# Patient Record
Sex: Male | Born: 1955 | Race: White | Hispanic: Yes | Marital: Single | Smoking: Never smoker
Health system: Southern US, Community
[De-identification: ages and names within clinical notes are randomized; demographics above are authoritative.]

## PROBLEM LIST (undated history)

## (undated) ENCOUNTER — Emergency Department (HOSPITAL_COMMUNITY): Admission: EM | Payer: Self-pay | Source: Home / Self Care

## (undated) DIAGNOSIS — E119 Type 2 diabetes mellitus without complications: Secondary | ICD-10-CM

## (undated) DIAGNOSIS — I10 Essential (primary) hypertension: Secondary | ICD-10-CM

## (undated) DIAGNOSIS — I639 Cerebral infarction, unspecified: Secondary | ICD-10-CM

## (undated) HISTORY — PX: APPENDECTOMY: SHX54

---

## 2016-08-16 ENCOUNTER — Encounter: Payer: Self-pay | Admitting: Pediatric Intensive Care

## 2016-08-22 ENCOUNTER — Encounter: Payer: Self-pay | Admitting: Pediatric Intensive Care

## 2016-08-22 DIAGNOSIS — E119 Type 2 diabetes mellitus without complications: Secondary | ICD-10-CM

## 2016-08-22 DIAGNOSIS — E118 Type 2 diabetes mellitus with unspecified complications: Secondary | ICD-10-CM

## 2016-08-22 LAB — GLUCOSE, POCT (MANUAL RESULT ENTRY): POC Glucose: 284 mg/dl — AB (ref 70–99)

## 2016-09-01 NOTE — Congregational Nurse Program (Signed)
Congregational Nurse Program Note  Date of Encounter: 08/16/2016  Past Medical History: No past medical history on file.  Encounter Details:     CNP Questionnaire - 08/16/16 0900      Patient Demographics   Is this a new or existing patient? New   Patient is considered a/an Refugee   Race Latino/Hispanic     Patient Assistance   Location of Patient Assistance [facility]   Patient's financial/insurance status Medicaid   Uninsured Patient ([employer]ange Research officer, trade unionCard/Care Connects) No   Patient referred to apply for the following financial assistance Medicaid   Food insecurities addressed Not Applicable   Transportation assistance Yes   Type of Assistance Bus Pass Given   Assistance securing medications No   Educational health offerings Diabetes;Navigating the healthcare system     Encounter Details   Primary purpose of visit Chronic Illness/Condition Visit;Navigating the Healthcare System   Was an Emergency Department visit averted? Yes   Does patient have a medical provider? No   Patient referred to Clinic   Was a mental health screening completed? (GAINS tool) No   Does patient have dental issues? No   Does patient have vision issues? No   Does your patient have an abnormal blood pressure today? No   Since previous encounter, have you referred patient for abnormal blood pressure that resulted in a new diagnosis or medication change? No   Does your patient have an abnormal blood glucose today? Yes   Since previous encounter, have you referred patient for abnormal blood glucose that resulted in a new diagnosis or medication change? No   Was there a life-saving intervention made? No     Client new to [facility]. Has a bedspace. [place] and [place] [facility]. States history of HTN, diabetes (on metformin only) and high cholesterol. He is currently out of metformin. He takes Lisinopril 20mg  qday and HCTZ 25mg  qday.  He does not have a glucometer. BG  done- 318 2 hours pc. [facility]. Buss passes given. Client will follow up in [facility] clinic for BG, BP checks. CN will contact client's PCP in [place] to check status of prescriptions and assist client with transferring Medicaid if necessary.

## 2016-09-03 DIAGNOSIS — E119 Type 2 diabetes mellitus without complications: Secondary | ICD-10-CM | POA: Insufficient documentation

## 2016-09-03 NOTE — Congregational Nurse Program (Signed)
Congregational Nurse Program Note  Date of Encounter: 08/22/2016  Past Medical History: No past medical history on file.  Encounter Details:     CNP Questionnaire - 08/22/16 0900      Patient Demographics   Is this a new or existing patient? Existing   Patient is considered a/an Refugee   Race Latino/Hispanic     Patient Assistance   Location of Patient Assistance [facility]   Patient's financial/insurance status Medicaid   Uninsured Patient ([employer] officer, trade unionCard/Care Connects) No   Patient referred to apply for the following financial assistance Medicaid   Food insecurities addressed Not Applicable   Transportation assistance No   Type of Assistance Other   Assistance securing medications No   Educational health offerings Hypertension;Diabetes;Navigating the healthcare system     Encounter Details   Primary purpose of visit Chronic Illness/Condition Visit;Navigating the Healthcare System;Post PCP Visit   Was an Emergency Department visit averted? Not Applicable   Does patient have a medical provider? Yes   Patient referred to Clinic;Follow up with established PCP   Was a mental health screening completed? (GAINS tool) No   Does patient have dental issues? No   Does patient have vision issues? No   Does your patient have an abnormal blood pressure today? No   Since previous encounter, have you referred patient for abnormal blood pressure that resulted in a new diagnosis or medication change? No   Does your patient have an abnormal blood glucose today? No   Since previous encounter, have you referred patient for abnormal blood glucose that resulted in a new diagnosis or medication change? No   Was there a life-saving intervention made? No     BG check. Client [facility] and C. Evans RN assisted client with obtaining metformin prescription.Client states that he's having increased back pain and muscle spasms. [facility] as he has taken tramadol  in the past. BG 284 2 hours PC. CN will obtain glucometer for client so he can test fasting blood sugar.

## 2016-09-03 NOTE — Congregational Nurse Program (Signed)
Congregational Nurse Program Note  Date of Encounter: 08/17/2016  Past Medical History: No past medical history on file.  Encounter Details:     CNP Questionnaire - 08/22/16 0900      Patient Demographics   Is this a new or existing patient? Existing   Patient is considered a/an Refugee   Race Latino/Hispanic     Patient Assistance   Location of Patient Assistance [facility]   Patient's financial/insurance status Medicaid   Uninsured Patient ([employer] Research officer, trade unionCard/Care Connects) No   Patient referred to apply for the following financial assistance Medicaid   Food insecurities addressed Not Applicable   Transportation assistance No   Type of Assistance Other   Assistance securing medications No   Educational health offerings Hypertension;Diabetes;Navigating the healthcare system     Encounter Details   Primary purpose of visit Chronic Illness/Condition Visit;Navigating the Healthcare System;Post PCP Visit   Was an Emergency Department visit averted? Not Applicable   Does patient have a medical provider? Yes   Patient referred to Clinic;Follow up with established PCP   Was a mental health screening completed? (GAINS tool) No   Does patient have dental issues? No   Does patient have vision issues? No   Does your patient have an abnormal blood pressure today? No   Since previous encounter, have you referred patient for abnormal blood pressure that resulted in a new diagnosis or medication change? No   Does your patient have an abnormal blood glucose today? No   Since previous encounter, have you referred patient for abnormal blood glucose that resulted in a new diagnosis or medication change? No   Was there a life-saving intervention made? No    Assisted client with obtaining medications.  [facility] and given to client with instructions

## 2016-09-05 ENCOUNTER — Encounter: Payer: Self-pay | Admitting: Pediatric Intensive Care

## 2016-09-09 ENCOUNTER — Encounter: Payer: Self-pay | Admitting: Pediatric Intensive Care

## 2016-09-12 ENCOUNTER — Encounter: Payer: Self-pay | Admitting: Pediatric Intensive Care

## 2016-09-23 ENCOUNTER — Encounter: Payer: Self-pay | Admitting: Pediatric Intensive Care

## 2016-09-27 ENCOUNTER — Encounter: Payer: Self-pay | Admitting: Pediatric Intensive Care

## 2016-09-29 NOTE — Congregational Nurse Program (Signed)
Congregational Nurse Program Note  Date of Encounter: 09/23/2016  Past Medical History: No past medical history on file.  Encounter Details:     CNP Questionnaire - 09/23/16 1000      Patient Demographics   Is this a new or existing patient? Existing   Patient is considered a/an Refugee   Race Latino/Hispanic     Patient Assistance   Location of Patient Assistance [facility]   Patient's financial/insurance status Self-Pay (Uninsured)   Uninsured Patient ([employer]ange Research officer, trade unionCard/Care Connects) No   Patient referred to apply for the following financial assistance Medicaid;Orange Research officer, trade unionCard/Care Connects   Food insecurities addressed Not Applicable   Transportation assistance No   Assistance securing medications Yes   Type of Assistance Other   Educational health offerings Hypertension;Diabetes     Encounter Details   Primary purpose of visit Chronic Illness/Condition Visit;Education/Health Concerns;Navigating the Healthcare System   Was an Emergency Department visit averted? Not Applicable   Does patient have a medical provider? Yes   Patient referred to Follow up with established PCP   Was a mental health screening completed? (GAINS tool) No   Does patient have dental issues? No   Does patient have vision issues? No   Does your patient have an abnormal blood pressure today? No   Since previous encounter, have you referred patient for abnormal blood pressure that resulted in a new diagnosis or medication change? No   Does your patient have an abnormal blood glucose today? No   Since previous encounter, have you referred patient for abnormal blood glucose that resulted in a new diagnosis or medication change? No   Was there a life-saving intervention made? No     Client needs medication refills and glucometer test strips. Checking BGs in morning- states they've been "up and down". BG check on client glucomter 2 hours PC is 171.

## 2016-10-03 NOTE — Congregational Nurse Program (Signed)
Congregational Nurse Program Note  Date of Encounter: 09/12/2016  Past Medical History: No past medical history on file.  Encounter Details:     CNP Questionnaire - 09/23/16 1000      Patient Demographics   Is this a new or existing patient? Existing   Patient is considered a/an Refugee   Race Latino/Hispanic     Patient Assistance   Location of Patient Assistance [facility]   Patient's financial/insurance status Self-Pay (Uninsured)   Uninsured Patient ([employer] Research officer, trade unionCard/Care Connects) No   Patient referred to apply for the following financial assistance Medicaid;Orange Research officer, trade unionCard/Care Connects   Food insecurities addressed Not Applicable   Transportation assistance No   Assistance securing medications Yes   Type of Assistance Other   Educational health offerings Hypertension;Diabetes     Encounter Details   Primary purpose of visit Chronic Illness/Condition Visit;Education/Health Concerns;Navigating the Healthcare System   Was an Emergency Department visit averted? Not Applicable   Does patient have a medical provider? Yes   Patient referred to Follow up with established PCP   Was a mental health screening completed? (GAINS tool) No   Does patient have dental issues? No   Does patient have vision issues? No   Does your patient have an abnormal blood pressure today? No   Since previous encounter, have you referred patient for abnormal blood pressure that resulted in a new diagnosis or medication change? No   Does your patient have an abnormal blood glucose today? No   Since previous encounter, have you referred patient for abnormal blood glucose that resulted in a new diagnosis or medication change? No   Was there a life-saving intervention made? No     Client states his BG was 174 this AM, 190s over the weekend. Will continue to follow.

## 2016-10-03 NOTE — Congregational Nurse Program (Signed)
Congregational Nurse Program Note  Date of Encounter: 09/09/2016  Past Medical History: No past medical history on file.  Encounter Details:     CNP Questionnaire - 09/23/16 1000      Patient Demographics   Is this a new or existing patient? Existing   Patient is considered a/an Refugee   Race Latino/Hispanic     Patient Assistance   Location of Patient Assistance [facility]   Patient's financial/insurance status Self-Pay (Uninsured)   Uninsured Patient ([employer]ange Research officer, trade unionCard/Care Connects) No   Patient referred to apply for the following financial assistance Medicaid;Orange Research officer, trade unionCard/Care Connects   Food insecurities addressed Not Applicable   Transportation assistance No   Assistance securing medications Yes   Type of Assistance Other   Educational health offerings Hypertension;Diabetes     Encounter Details   Primary purpose of visit Chronic Illness/Condition Visit;Education/Health Concerns;Navigating the Healthcare System   Was an Emergency Department visit averted? Not Applicable   Does patient have a medical provider? Yes   Patient referred to Follow up with established PCP   Was a mental health screening completed? (GAINS tool) No   Does patient have dental issues? No   Does patient have vision issues? No   Does your patient have an abnormal blood pressure today? No   Since previous encounter, have you referred patient for abnormal blood pressure that resulted in a new diagnosis or medication change? No   Does your patient have an abnormal blood glucose today? No   Since previous encounter, have you referred patient for abnormal blood glucose that resulted in a new diagnosis or medication change? No   Was there a life-saving intervention made? No     Client states Bg was 225 this morning. Merformin has been increased to 1000mg  bid. Client states he is waiting to hear about legal status so he can re-a[pply for Medicaid. [facility].

## 2016-10-04 NOTE — Congregational Nurse Program (Signed)
Congregational Nurse Program Note  Date of Encounter: 09/05/2016  Past Medical History: No past medical history on file.  Encounter Details:     CNP Questionnaire - 09/23/16 1000      Patient Demographics   Is this a new or existing patient? Existing   Patient is considered a/an Refugee   Race Latino/Hispanic     Patient Assistance   Location of Patient Assistance [facility]   Patient's financial/insurance status Self-Pay (Uninsured)   Uninsured Patient ([employer]ange Research officer, trade unionCard/Care Connects) No   Patient referred to apply for the following financial assistance Medicaid;Orange Research officer, trade unionCard/Care Connects   Food insecurities addressed Not Applicable   Transportation assistance No   Assistance securing medications Yes   Type of Assistance Other   Educational health offerings Hypertension;Diabetes     Encounter Details   Primary purpose of visit Chronic Illness/Condition Visit;Education/Health Concerns;Navigating the Healthcare System   Was an Emergency Department visit averted? Not Applicable   Does patient have a medical provider? Yes   Patient referred to Follow up with established PCP   Was a mental health screening completed? (GAINS tool) No   Does patient have dental issues? No   Does patient have vision issues? No   Does your patient have an abnormal blood pressure today? No   Since previous encounter, have you referred patient for abnormal blood pressure that resulted in a new diagnosis or medication change? No   Does your patient have an abnormal blood glucose today? No   Since previous encounter, have you referred patient for abnormal blood glucose that resulted in a new diagnosis or medication change? No   Was there a life-saving intervention made? No     BG 237 on client glucometer. Client states BGs have been variable. History showed AM AC BGs from 196-420. CN will [facility].

## 2016-10-17 ENCOUNTER — Encounter: Payer: Self-pay | Admitting: Pediatric Intensive Care

## 2016-10-21 ENCOUNTER — Encounter: Payer: Self-pay | Admitting: Pediatric Intensive Care

## 2016-10-21 NOTE — Congregational Nurse Program (Signed)
Congregational Nurse Program Note  Date of Encounter: 10/21/2016  Past Medical History: No past medical history on file.  Encounter Details:     CNP Questionnaire - 10/21/16 0900      Patient Demographics   Is this a new or existing patient? Existing   Patient is considered a/an Refugee   Race Latino/Hispanic     Patient Assistance   Location of Patient Assistance [facility]   Patient's financial/insurance status Self-Pay (Uninsured);Low Income   Uninsured Patient (Orange Research officer, trade unionCard/Care Connects) No   Patient referred to apply for the following financial assistance [employer] GoldCard/Care Connects   Food insecurities addressed Not Technical brewerApplicable   Transportation assistance No   Assistance securing medications Yes   Type of Holiday representativeAssistance [facility]   Educational health offerings Medications     Encounter Details   Primary purpose of visit Navigating the Healthcare System;Other   Was an Emergency Department visit averted? Not Applicable   Does patient have a medical provider? Yes   Patient referred to Follow up with established PCP   Was a mental health screening completed? (GAINS tool) No   Does patient have dental issues? No   Does patient have vision issues? No   Does your patient have an abnormal blood pressure today? No   Since previous encounter, have you referred patient for abnormal blood pressure that resulted in a new diagnosis or medication change? No   Does your patient have an abnormal blood glucose today? No   Since previous encounter, have you referred patient for abnormal blood glucose that resulted in a new diagnosis or medication change? No   Was there a life-saving intervention made? No     [facility] that [facility] CN called [facility] on 1/22 to have them transfer rx but pharmacist stated he had already transferred all medications. CN called [facility] on 1/22 and pharmacist stated that they had notified [facility] that  client needed refill. NP Placey contacted CN via email today [facility]. [facility] that [facility].

## 2016-10-28 ENCOUNTER — Encounter: Payer: Self-pay | Admitting: Pediatric Intensive Care

## 2016-11-07 NOTE — Congregational Nurse Program (Signed)
Congregational Nurse Program Note  Date of Encounter: 10/26/2016  Past Medical History: No past medical history on file.  Encounter Details:     CNP Questionnaire - 10/26/16 1414      Patient Demographics   Is this a new or existing patient? Existing   Patient is considered a/an Refugee   Race Latino/Hispanic     Patient Assistance   Location of Patient Assistance [facility]   Patient's financial/insurance status Self-Pay (Uninsured);Low Income   Uninsured Patient ([employer] Research officer, trade unionCard/Care Connects) No   Patient referred to apply for the following financial assistance [employer] GoldCard/Care Connects   Food insecurities addressed Not Technical brewerApplicable   Transportation assistance No   Assistance securing medications Yes   Type of Assistance Other   Educational health offerings Medications     Encounter Details   Primary purpose of visit Navigating the Healthcare System;Other   Was an Emergency Department visit averted? Not Applicable   Does patient have a medical provider? No   Patient referred to Follow up with established PCP   Was a mental health screening completed? (GAINS tool) No   Does patient have dental issues? No   Does patient have vision issues? No   Does your patient have an abnormal blood pressure today? No   Since previous encounter, have you referred patient for abnormal blood pressure that resulted in a new diagnosis or medication change? No   Does your patient have an abnormal blood glucose today? No   Since previous encounter, have you referred patient for abnormal blood glucose that resulted in a new diagnosis or medication change? No   Was there a life-saving intervention made? No     Assisted client in obtaining filled scripts from [facility]

## 2016-11-08 ENCOUNTER — Encounter: Payer: Self-pay | Admitting: Pediatric Intensive Care

## 2016-11-08 DIAGNOSIS — E119 Type 2 diabetes mellitus without complications: Secondary | ICD-10-CM

## 2016-11-08 LAB — GLUCOSE, POCT (MANUAL RESULT ENTRY): POC GLUCOSE: 429 mg/dL — AB (ref 70–99)

## 2016-11-10 NOTE — Congregational Nurse Program (Signed)
Congregational Nurse Program Note  Date of Encounter: 09/27/2016  Past Medical History: No past medical history on file.  Encounter Details:  BP check. Client needs Gabapentin refill. [facility] CN will assist with medications as needed.

## 2016-11-12 NOTE — Congregational Nurse Program (Signed)
Congregational Nurse Program Note  Date of Encounter: 10/17/2016  Past Medical History: No past medical history on file.  Encounter Details:     CNP Questionnaire - 10/26/16 1414      Patient Demographics   Is this a new or existing patient? Existing   Patient is considered a/an Refugee   Race Latino/Hispanic     Patient Assistance   Location of Patient Assistance [facility]   Patient's financial/insurance status Self-Pay (Uninsured);Low Income   Uninsured Patient ([employer] Research officer, trade unionCard/Care Connects) No   Patient referred to apply for the following financial assistance [employer] GoldCard/Care Connects   Food insecurities addressed Not Technical brewerApplicable   Transportation assistance No   Assistance securing medications Yes   Type of Assistance Other   Educational health offerings Medications     Encounter Details   Primary purpose of visit Navigating the Healthcare System;Other   Was an Emergency Department visit averted? Not Applicable   Does patient have a medical provider? No   Patient referred to Follow up with established PCP   Was a mental health screening completed? (GAINS tool) No   Does patient have dental issues? No   Does patient have vision issues? No   Does your patient have an abnormal blood pressure today? No   Since previous encounter, have you referred patient for abnormal blood pressure that resulted in a new diagnosis or medication change? No   Does your patient have an abnormal blood glucose today? No   Since previous encounter, have you referred patient for abnormal blood glucose that resulted in a new diagnosis or medication change? No   Was there a life-saving intervention made? No     Client states he is out of celexa. CN called [facility]. Client has refills but they will not refill unless client self-pays. [facility]

## 2016-11-24 NOTE — Congregational Nurse Program (Signed)
Congregational Nurse Program Note  Date of Encounter: 10/28/2016  Past Medical History: No past medical history on file.  Encounter Details:     CNP Questionnaire - 10/28/16 1000      Patient Demographics   Is this a new or existing patient? Existing   Patient is considered a/an Refugee   Race Latino/Hispanic     Patient Assistance   Location of Patient Assistance [facility]   Patient's financial/insurance status Self-Pay (Uninsured);Low Income   Uninsured Patient ([employer] Research officer, trade unionCard/Care Connects) Yes   Interventions Appt. has been completed   Patient referred to apply for the following financial assistance [employer]/Care Connects   Food insecurities addressed Not Technical brewerApplicable   Transportation assistance Yes   Type of Assistance [employer] Pass Given   Assistance securing medications Yes   Type of Assistance Other   Educational health offerings Medications;Navigating the healthcare system     Encounter Details   Primary purpose of visit Education/Health Concerns;Navigating the Healthcare System   Was an Emergency Department visit averted? Not Applicable   Does patient have a medical provider? Yes   Patient referred to Follow up with established PCP   Was a mental health screening completed? (GAINS tool) No   Does patient have dental issues? No   Does patient have vision issues? No   Does your patient have an abnormal blood pressure today? No   Since previous encounter, have you referred patient for abnormal blood pressure that resulted in a new diagnosis or medication change? No   Does your patient have an abnormal blood glucose today? No   Since previous encounter, have you referred patient for abnormal blood glucose that resulted in a new diagnosis or medication change? No   Was there a life-saving intervention made? No     [facility]. BP check.

## 2016-11-25 ENCOUNTER — Encounter: Payer: Self-pay | Admitting: Pediatric Intensive Care

## 2016-11-27 NOTE — Congregational Nurse Program (Signed)
Congregational Nurse Program Note  Date of Encounter: 11/08/2016  Past Medical History: No past medical history on file.  Encounter Details:     CNP Questionnaire - 11/08/16 0830      Patient Demographics   Is this a new or existing patient? Existing   Patient is considered a/an Refugee   Race Latino/Hispanic     Patient Assistance   Location of Patient Assistance [facility]   Patient's financial/insurance status Self-Pay (Uninsured);Low Income   Uninsured Patient ([place] Card/Care Connects) Yes   Interventions Assisted patient in making appt.   Patient referred to apply for the following financial assistance [employer] Card/Care Connects   Food insecurities addressed Not Applicable   Transportation assistance Yes   Type of Assistance [employer] Pass Given   Assistance securing medications No   Type of Assistance Other   Educational health offerings Diabetes     Encounter Details   Primary purpose of visit Chronic Illness/Condition Visit;Education/Health Concerns   Was an Emergency Department visit averted? Yes   Does patient have a medical provider? Yes   Patient referred to Follow up with established PCP   Was a mental health screening completed? (GAINS tool) No   Does patient have dental issues? No   Does patient have vision issues? No   Does your patient have an abnormal blood pressure today? No   Since previous encounter, have you referred patient for abnormal blood pressure that resulted in a new diagnosis or medication change? No   Does your patient have an abnormal blood glucose today? Yes   Since previous encounter, have you referred patient for abnormal blood glucose that resulted in a new diagnosis or medication change? No   Was there a life-saving intervention made? No     Client states he feels sick. Client is pale, somewhat diaphoretic. States his AM blood glucose have been 150-190. BG in office 429. CN [facility] and gave bus passes to client to follow up at  clinic.

## 2016-12-05 ENCOUNTER — Encounter: Payer: Self-pay | Admitting: Pediatric Intensive Care

## 2016-12-17 ENCOUNTER — Encounter (HOSPITAL_COMMUNITY): Payer: Self-pay | Admitting: Emergency Medicine

## 2016-12-17 ENCOUNTER — Emergency Department (HOSPITAL_COMMUNITY)
Admission: EM | Admit: 2016-12-17 | Discharge: 2016-12-17 | Disposition: A | Discharge status: Home / Self Care | Payer: Self-pay | Attending: Emergency Medicine | Admitting: Emergency Medicine

## 2016-12-17 ENCOUNTER — Emergency Department (HOSPITAL_COMMUNITY): Payer: Self-pay

## 2016-12-17 DIAGNOSIS — I10 Essential (primary) hypertension: Secondary | ICD-10-CM | POA: Insufficient documentation

## 2016-12-17 DIAGNOSIS — M5442 Lumbago with sciatica, left side: Secondary | ICD-10-CM | POA: Insufficient documentation

## 2016-12-17 DIAGNOSIS — Z8673 Personal history of transient ischemic attack (TIA), and cerebral infarction without residual deficits: Secondary | ICD-10-CM | POA: Insufficient documentation

## 2016-12-17 DIAGNOSIS — E119 Type 2 diabetes mellitus without complications: Secondary | ICD-10-CM | POA: Insufficient documentation

## 2016-12-17 DIAGNOSIS — M5441 Lumbago with sciatica, right side: Secondary | ICD-10-CM | POA: Insufficient documentation

## 2016-12-17 DIAGNOSIS — Z79899 Other long term (current) drug therapy: Secondary | ICD-10-CM | POA: Insufficient documentation

## 2016-12-17 DIAGNOSIS — Z7982 Long term (current) use of aspirin: Secondary | ICD-10-CM | POA: Insufficient documentation

## 2016-12-17 DIAGNOSIS — Z7984 Long term (current) use of oral hypoglycemic drugs: Secondary | ICD-10-CM | POA: Insufficient documentation

## 2016-12-17 HISTORY — DX: Cerebral infarction, unspecified: I63.9

## 2016-12-17 HISTORY — DX: Essential (primary) hypertension: I10

## 2016-12-17 HISTORY — DX: Type 2 diabetes mellitus without complications: E11.9

## 2016-12-17 LAB — URINALYSIS, ROUTINE W REFLEX MICROSCOPIC
BILIRUBIN URINE: NEGATIVE
Glucose, UA: NEGATIVE mg/dL
HGB URINE DIPSTICK: NEGATIVE
KETONES UR: 5 mg/dL — AB
Leukocytes, UA: NEGATIVE
Nitrite: NEGATIVE
PH: 5 (ref 5.0–8.0)
Protein, ur: NEGATIVE mg/dL
Specific Gravity, Urine: 1.025 (ref 1.005–1.030)

## 2016-12-17 MED ORDER — PREDNISONE 20 MG PO TABS: ORAL_TABLET | ORAL | 0 refills | Status: DC

## 2016-12-17 MED ORDER — HYDROCODONE-ACETAMINOPHEN 5-325 MG PO TABS
2.0000 | ORAL_TABLET | Freq: Once | ORAL | Status: AC
  Administered 2016-12-17: 2 via ORAL
  Filled 2016-12-17: qty 2

## 2016-12-17 MED ORDER — PREDNISONE 20 MG PO TABS
60.0000 mg | ORAL_TABLET | Freq: Once | ORAL | Status: AC
  Administered 2016-12-17: 60 mg via ORAL
  Filled 2016-12-17: qty 3

## 2016-12-17 MED ORDER — IBUPROFEN 400 MG PO TABS: 400.0000 mg | ORAL_TABLET | Freq: Four times a day (QID) | ORAL | 0 refills | Status: DC | PRN

## 2016-12-17 NOTE — ED Triage Notes (Addendum)
Pt to ER from home with complaint of lumbar back pain onset greater than 4 months ago. Ambulatory at present without difficulty, denies loss of bowel/bladder. Reports pain radiates down left leg. A/o x4.

## 2016-12-17 NOTE — ED Notes (Signed)
Pt transporting to xray. VSS

## 2016-12-17 NOTE — ED Provider Notes (Signed)
[facility]-EMERGENCY DEPT Provider Note   CSN: [id number] Arrival date & time: 12/17/16  0941     History   Chief Complaint Chief Complaint  Patient presents with  . Back Pain    HPI Terry Davidson is a 61 y.o. male.   Back Pain   This is a chronic problem. The current episode started more than 1 week ago. The problem occurs constantly. The problem has been gradually worsening. The pain is associated with no known injury. The pain is present in the lumbar spine. The quality of the pain is described as stabbing. The pain is mild.    Past Medical History:  Diagnosis Date  . Diabetes mellitus without complication (HCC)   . Hypertension   . Stroke [facility](HCC)     Patient Active Problem List   Diagnosis Date Noted  . Diabetes (HCC) 09/03/2016    Past Surgical History:  Procedure Laterality Date  . APPENDECTOMY         Home Medications    Prior to Admission medications   Medication Sig Start Date End Date Taking? Authorizing Provider  ASPIR-LOW 81 MG EC tablet Take 81 mg by mouth daily. 10/04/16  Yes Historical Provider, MD  atorvastatin (LIPITOR) 40 MG tablet Take 40 mg by mouth daily. 10/04/16  Yes Historical Provider, MD  cyclobenzaprine (FLEXERIL) 10 MG tablet Take 10 mg by mouth 3 (three) times daily as needed for muscle spasms.   Yes Historical Provider, MD  gabapentin (NEURONTIN) 300 MG capsule Take 300 mg by mouth at bedtime. 10/04/16  Yes Historical Provider, MD  hydrochlorothiazide (HYDRODIURIL) 25 MG tablet Take 25 mg by mouth daily. 10/04/16  Yes Historical Provider, MD  lisinopril (PRINIVIL,ZESTRIL) 20 MG tablet Take 20 mg by mouth daily. 10/04/16  Yes Historical Provider, MD  metFORMIN (GLUCOPHAGE) 500 MG tablet Take 500 mg by mouth 2 (two) times daily. 10/04/16  Yes Historical Provider, MD  ibuprofen (ADVIL,MOTRIN) 400 MG tablet Take 1 tablet (400 mg total) by mouth every 6 (six) hours as needed. 12/17/16   Marily MemosJason Kiani Wurtzel, MD  predniSONE (DELTASONE) 20 MG tablet 3 tabs po daily x  3 days, then 2 tabs x 3 days, then 1.5 tabs x 3 days, then 1 tab x 3 days, then 0.5 tabs x 3 days 12/17/16   Marily MemosJason Rafal Archuleta, MD    Family History History reviewed. No pertinent family history.  Social History Social History  Substance Use Topics  . Smoking status: Never Smoker  . Smokeless tobacco: Never Used  . Alcohol use No     Allergies   Ciprofloxacin and Morphine and related   Review of Systems Review of Systems  Musculoskeletal: Positive for back pain.  All other systems reviewed and are negative.    Physical Exam Updated Vital Signs BP 140/89   Pulse 70   Temp 98.1 F (36.7 C) (Oral)   Resp 17   SpO2 98%   Physical Exam  Constitutional: He appears well-developed and well-nourished.  HENT:  Head: Normocephalic and atraumatic.  Eyes: Conjunctivae and EOM are normal.  Neck: Normal range of motion.  Cardiovascular: Normal rate.   Pulmonary/Chest: Effort normal. No respiratory distress.  Abdominal: Soft. He exhibits no distension.  Musculoskeletal: Normal range of motion. He exhibits tenderness (midline lumbar). He exhibits no edema or deformity.  Neurological: He is alert.  Nursing note and vitals reviewed.    ED Treatments / Results  Labs (all labs ordered are listed, but only abnormal results are displayed) Labs Reviewed  URINALYSIS, ROUTINE  W REFLEX MICROSCOPIC - Abnormal; Notable for the following:       Result Value   Ketones, ur 5 (*)    All other components within normal limits    EKG  EKG Interpretation None       Radiology Dg Lumbar Spine Complete  Result Date: 12/17/2016 CLINICAL DATA:  Low back pain.  Clinical concern for fracture. EXAM: LUMBAR SPINE - COMPLETE 4+ VIEW COMPARISON:  None. FINDINGS: This report assumes 5 non rib-bearing lumbar vertebrae. Lumbar vertebral body heights are preserved, with no fracture. Moderate to severe degenerative disc disease throughout the lumbar spine, most prominent at L3-4, L4-5 and L5-S1. No  spondylolisthesis. Bilateral lower lumbar spine facet arthropathy. No aggressive appearing focal osseous lesions. IMPRESSION: 1. No lumbar spine fracture or spondylolisthesis. 2. Moderate to severe multilevel degenerative disc disease, most prominent in the lower lumbar spine. 3. Bilateral lower lumbar facet arthropathy. Electronically Signed   By: Delbert Phenix M.D.   On: 12/17/2016 10:54    Procedures Procedures (including critical care time)  Medications Ordered in ED Medications  HYDROcodone-acetaminophen (NORCO/VICODIN) 5-325 MG per tablet 2 tablet (2 tablets Oral Given 12/17/16 1051)  predniSONE (DELTASONE) tablet 60 mg (60 mg Oral Given 12/17/16 1051)     Initial Impression / Assessment and Plan / ED Course  I have reviewed the triage vital signs and the nursing notes.  Pertinent labs & imaging results that were available during my care of the patient were reviewed by me and considered in my medical decision making (see chart for details).     No acute change or new red flags in back pain. Improved in ER. Needs to establish nsg/pcp and follow up.   Final Clinical Impressions(s) / ED Diagnoses   Final diagnoses:  Midline low back pain with bilateral sciatica, unspecified chronicity    New Prescriptions Discharge Medication List as of 12/17/2016  2:34 PM    START taking these medications   Details  ibuprofen (ADVIL,MOTRIN) 400 MG tablet Take 1 tablet (400 mg total) by mouth every 6 (six) hours as needed., Starting Sat 12/17/2016, Print    predniSONE (DELTASONE) 20 MG tablet 3 tabs po daily x 3 days, then 2 tabs x 3 days, then 1.5 tabs x 3 days, then 1 tab x 3 days, then 0.5 tabs x 3 days, Print         Marily Memos, MD 12/18/16 1238

## 2016-12-19 MED FILL — predniSONE 20 MG TABS: 20 | 15 days supply | Qty: 27 | Fill #0

## 2016-12-19 MED FILL — IBUPROFEN 400 MG TABLET: 400 | 8 days supply | Qty: 30 | Fill #0

## 2016-12-29 NOTE — Congregational Nurse Program (Signed)
Congregational Nurse Program Note  Date of Encounter: 11/25/2016  Past Medical History: Past Medical History:  Diagnosis Date  . Diabetes mellitus without complication (HCC)   . Hypertension   . Stroke [facility])     Encounter Details:  Client states blood sugars improved since starting glipizide. Also states he is taking double amount of BP medication prescribed by NP. CN will clarify this with [facility] clinic.

## 2016-12-31 NOTE — Congregational Nurse Program (Signed)
Congregational Nurse Program Note  Date of Encounter: 12/05/2016  Past Medical History: Past Medical History:  Diagnosis Date  . Diabetes mellitus without complication (HCC)   . Hypertension   . Stroke [facility])     Encounter Details:     CNP Questionnaire - 12/05/16 0930      Patient Demographics   Is this a new or existing patient? Existing   Patient is considered a/an Refugee   Race Latino/Hispanic     Patient Assistance   Location of Patient Assistance [facility]   Patient's financial/insurance status Self-Pay (Uninsured);Low Income   Uninsured Patient ([employer] Research officer, trade union) Yes   Interventions Not Applicable   Patient referred to apply for the following financial assistance [employer] insecurities addressed Not Technical brewer Yes   Type of Assistance [employer] Given   Assistance securing medications No   Type of Pharmacist, hospital the healthcare system     Encounter Details   Primary purpose of visit Navigating the Healthcare System   Was an Emergency Department visit averted? Not Applicable   Does patient have a medical provider? Yes   Patient referred to Follow up with established PCP   Was a mental health screening completed? (GAINS tool) No   Does patient have dental issues? No   Does patient have vision issues? No   Does your patient have an abnormal blood pressure today? No   Since previous encounter, have you referred patient for abnormal blood pressure that resulted in a new diagnosis or medication change? No   Does your patient have an abnormal blood glucose today? No   Since previous encounter, have you referred patient for abnormal blood glucose that resulted in a new diagnosis or medication change? No   Was there a life-saving intervention made? No     Client needs bus passes to get to DSS to complete [employer] application.

## 2017-06-06 ENCOUNTER — Encounter: Payer: Self-pay | Admitting: Pediatric Intensive Care

## 2017-07-02 NOTE — Congregational Nurse Program (Signed)
Congregational Nurse Program Note  Date of Encounter: 06/06/2017  Past Medical History: Past Medical History:  Diagnosis Date  . Diabetes mellitus without complication (HCC)   . Hypertension   . Stroke [facility])     Encounter Details:     CNP Questionnaire - 06/06/17 1045      Patient Demographics   Is this a new or existing patient? New   Patient is considered a/an Refugee   Race Latino/Hispanic     Patient Assistance   Location of Patient Assistance [facility]   Patient's financial/insurance status [employer] Research officer, trade union;Self-Pay (Uninsured);Low Income   Uninsured Patient ([place] Card/Care Connects) Yes   Interventions Appt. has been completed;Follow-up/Education/Support provided after completed appt.   Patient referred to apply for the following financial assistance Not Applicable   Food insecurities addressed Not Applicable   Transportation assistance Yes   Type of Assistance Bus Pass Given   Assistance securing medications Yes   Type of Assistance [facility] Department   Educational health offerings Navigating the healthcare system;Medications     Encounter Details   Primary purpose of visit Navigating the Healthcare System   Was an Emergency Department visit averted? Not Applicable   Does patient have a medical provider? Yes   Patient referred to Follow up with established PCP   Was a mental health screening completed? (GAINS tool) No   Does patient have dental issues? No   Does patient have vision issues? No   Does your patient have an abnormal blood pressure today? Yes   Since previous encounter, have you referred patient for abnormal blood pressure that resulted in a new diagnosis or medication change? No   Does your patient have an abnormal blood glucose today? No   Since previous encounter, have you referred patient for abnormal blood glucose that resulted in a new diagnosis or medication change? No   Was there a life-saving intervention made? No     Client needs assistance with picking up his meds at [facility]. CN called [facility]- meds will be delivered to [facility] tomorrow. CN notified client.

## 2017-07-04 ENCOUNTER — Encounter: Payer: Self-pay | Admitting: Pediatric Intensive Care

## 2017-07-04 NOTE — Congregational Nurse Program (Signed)
Congregational Nurse Program Note  Date of Encounter: 07/04/2017  Past Medical History: Past Medical History:  Diagnosis Date  . Diabetes mellitus without complication (HCC)   . Hypertension   . Stroke [facility])     Encounter Details:     CNP Questionnaire - 07/04/17 1615      Patient Demographics   Is this a new or existing patient? New   Patient is considered a/an Refugee   Race Latino/Hispanic     Patient Assistance   Location of Patient Assistance [employer]   Patient's financial/insurance status Orange Research officer, trade union;Self-Pay (Uninsured)   Uninsured Patient ([place] Card/Care Connects) Yes   Interventions Appt. has been completed   Patient referred to apply for the following financial assistance Not Applicable   Food insecurities addressed Not Applicable   Transportation assistance No   Assistance securing medications No   Educational health offerings Medications;Safety     Encounter Details   Primary purpose of visit Safety;Education/Health Concerns   Was an Emergency Department visit averted? Not Applicable   Does patient have a medical provider? Yes   Patient referred to Follow up with established PCP   Was a mental health screening completed? (GAINS tool) No   Does patient have dental issues? No   Does patient have vision issues? No   Does your patient have an abnormal blood pressure today? Yes   Since previous encounter, have you referred patient for abnormal blood pressure that resulted in a new diagnosis or medication change? No   Does your patient have an abnormal blood glucose today? No   Since previous encounter, have you referred patient for abnormal blood glucose that resulted in a new diagnosis or medication change? No   Was there a life-saving intervention made? No     Client referred to see CN at this clinic site by Deborah Chalk CN [facility] nurse. Client expressed that he is depressed and anxious due to his immigration status. Client has open case with  [employer]. Client states that he has all medication except BP medication. Client states that he takes medication as prescribed. BP was somewhat elevated. CN called MA Placey NP to clarify BP medication. Instructed to have client increase metoprolol to  bid and follow up for BP check next week. All instructions written for client. Client has follow up appointment at [facility] on 10/29.

## 2017-07-11 ENCOUNTER — Encounter: Payer: Self-pay | Admitting: Pediatric Intensive Care

## 2017-07-21 ENCOUNTER — Encounter: Payer: Self-pay | Admitting: Pediatric Intensive Care

## 2017-08-04 NOTE — Congregational Nurse Program (Signed)
Congregational Nurse Program Note  Date of Encounter: 07/11/2017  Past Medical History: Past Medical History:  Diagnosis Date  . Diabetes mellitus without complication (HCC)   . Hypertension   . Stroke [facility](HCC)     Encounter Details: CNP Questionnaire - 07/21/17 1045      Questionnaire   Patient Status  Refugee    Race  Hispanic or Latino    [facility]    Insurance  Not Applicable    Uninsured  Uninsured (Subsequent visits/quarter)    Food  No food insecurities    Housing/Utilities  No permanent housing    Transportation  Yes, need transportation assistance;Provided transportation assistance (bus pass, taxi voucher, etc.)    Interpersonal Safety  Yes, feel physically and emotionally safe where you currently live    Medication  Yes, have medication insecurities    Medical Provider  Yes    Referrals  Other;Primary Care Provider/Clinic    ED Visit Averted  Not Applicable    Life-Saving Intervention Made  Not Applicable       BP check. Client has doubled his metoprolol as directed. States he has PCP appointment on 10/19. Follow up in CN clinic as needed.

## 2017-08-04 NOTE — Congregational Nurse Program (Signed)
Congregational Nurse Program Note  Date of Encounter: 07/21/2017  Past Medical History: Past Medical History:  Diagnosis Date  . Diabetes mellitus without complication (HCC)   . Hypertension   . Stroke [facility](HCC)     Encounter Details: CNP Questionnaire - 07/21/17 1045      Questionnaire   Patient Status  Refugee    Race  Hispanic or Latino    [facility]erved [facility]    Insurance  Not Applicable    Uninsured  Uninsured (Subsequent visits/quarter)    Food  No food insecurities    Housing/Utilities  No permanent housing    Transportation  Yes, need transportation assistance;Provided transportation assistance (bus pass, taxi voucher, etc.)    Interpersonal Safety  Yes, feel physically and emotionally safe where you currently live    Medication  Yes, have medication insecurities    Medical Provider  Yes    Referrals  Other;Primary Care Provider/Clinic    ED Visit Averted  Not Applicable    Life-Saving Intervention Made  Not Applicable     [facility] but doesn't [facility] where. CN [facility] and client has medication there. CN provided bus passes and gave detailed directions for busses to pharmacy. Client sttae [facility]. CN will email Kara MeadM Placey FNP regarding BP/HR. Follow up in CN clinic as needed.

## 2017-08-07 NOTE — Congregational Nurse Program (Signed)
Congregational Nurse Program Note  Date of Encounter: 07/03/2017  Past Medical History: Past Medical History:  Diagnosis Date  . Diabetes mellitus without complication (HCC)   . Hypertension   . Stroke [facility](HCC)     Encounter Details: CNP Questionnaire - 07/21/17 1045      Questionnaire   Patient Status  Refugee    Race  Hispanic or Latino    Location [facility] Served At  [facility]    Insurance  Not Applicable    Uninsured  Uninsured (Subsequent visits/quarter)    Food  No food insecurities    Housing/Utilities  No permanent housing    Transportation  Yes, need transportation assistance;Provided transportation assistance (bus pass, taxi voucher, etc.)    Interpersonal Safety  Yes, feel physically and emotionally safe where you currently live    Medication  Yes, have medication insecurities    Medical Provider  Yes    Referrals  Other;Primary Care Provider/Clinic    ED Visit Averted  Not Applicable    Life-Saving Intervention Made  Not Applicable      States he is feeling suicidal without a definite plan however has thoughts of cutting himself or hanging.  States is fearful of being deported.  States there are issues with his immigration status and he does not know what to do.  He states he would "rather die, than go back".  Scheduled to see Lavinia SharpsMary Ann Placey NP today.  TC to [place]Victoria Hussey CN- RN who works with client through [employer].  Client instructed to go to [employer] to discuss his immigration status and receive support.  Encouraged to return to see me.  Bus passes provided to go to [employer].

## 2017-10-20 ENCOUNTER — Encounter: Payer: Self-pay | Admitting: Pediatric Intensive Care

## 2017-11-11 NOTE — Congregational Nurse Program (Signed)
Congregational Nurse Program Note  Date of Encounter: 10/20/2017  Past Medical History: Past Medical History:  Diagnosis Date  . Diabetes mellitus without complication (HCC)   . Hypertension   . Stroke [facility](HCC)     Encounter Details: CNP Questionnaire - 10/20/17 0915      Questionnaire   Patient Status  Refugee    Race  Hispanic or Latino    Location [facility] Served At  [facility]    Insurance  Not Applicable    Uninsured  Uninsured (Subsequent visits/quarter)    Food  No food insecurities    Housing/Utilities  No permanent housing    Transportation  No transportation needs    Interpersonal Safety  Yes, feel physically and emotionally safe where you currently live    Medication  No medication insecurities    Medical Provider  Yes    Referrals  Other    ED Visit Averted  Not Applicable    Life-Saving Intervention Made  Not Applicable      Client needs I-693 form for immigration physical. Client also [facility]. CN will coordinate with Annye AsaLauren Holt, client's case worker at [facility] [facility].

## 2019-11-16 ENCOUNTER — Emergency Department (HOSPITAL_COMMUNITY)
Admission: EM | Admit: 2019-11-16 | Discharge: 2019-11-17 | Disposition: A | Discharge status: Home / Self Care | Payer: Self-pay | Attending: Emergency Medicine | Admitting: Emergency Medicine

## 2019-11-16 ENCOUNTER — Encounter (HOSPITAL_COMMUNITY): Payer: Self-pay | Admitting: Emergency Medicine

## 2019-11-16 ENCOUNTER — Other Ambulatory Visit: Payer: Self-pay

## 2019-11-16 DIAGNOSIS — R42 Dizziness and giddiness: Secondary | ICD-10-CM | POA: Insufficient documentation

## 2019-11-16 DIAGNOSIS — Z7984 Long term (current) use of oral hypoglycemic drugs: Secondary | ICD-10-CM | POA: Insufficient documentation

## 2019-11-16 DIAGNOSIS — I10 Essential (primary) hypertension: Secondary | ICD-10-CM | POA: Insufficient documentation

## 2019-11-16 DIAGNOSIS — Z79899 Other long term (current) drug therapy: Secondary | ICD-10-CM | POA: Insufficient documentation

## 2019-11-16 DIAGNOSIS — E119 Type 2 diabetes mellitus without complications: Secondary | ICD-10-CM | POA: Insufficient documentation

## 2019-11-16 LAB — DIFFERENTIAL
Abs Immature Granulocytes: 0.01 10*3/uL (ref 0.00–0.07)
Basophils Absolute: 0.1 10*3/uL (ref 0.0–0.1)
Basophils Relative: 1 %
Eosinophils Absolute: 0.2 10*3/uL (ref 0.0–0.5)
Eosinophils Relative: 3 %
Immature Granulocytes: 0 %
Lymphocytes Relative: 17 %
Lymphs Abs: 1.2 10*3/uL (ref 0.7–4.0)
Monocytes Absolute: 0.4 10*3/uL (ref 0.1–1.0)
Monocytes Relative: 6 %
Neutro Abs: 5.2 10*3/uL (ref 1.7–7.7)
Neutrophils Relative %: 73 %

## 2019-11-16 LAB — COMPREHENSIVE METABOLIC PANEL
ALT: 20 U/L (ref 0–44)
AST: 19 U/L (ref 15–41)
Albumin: 4 g/dL (ref 3.5–5.0)
Alkaline Phosphatase: 69 U/L (ref 38–126)
Anion gap: 10 (ref 5–15)
BUN: 18 mg/dL (ref 8–23)
CO2: 23 mmol/L (ref 22–32)
Calcium: 9.2 mg/dL (ref 8.9–10.3)
Chloride: 104 mmol/L (ref 98–111)
Creatinine, Ser: 1.16 mg/dL (ref 0.61–1.24)
GFR calc Af Amer: >60 mL/min (ref >60)
GFR calc non Af Amer: >60 mL/min (ref >60)
Glucose, Bld: 257 mg/dL — ABNORMAL HIGH (ref 70–99)
Potassium: 3.9 mmol/L (ref 3.5–5.1)
Sodium: 137 mmol/L (ref 135–145)
Total Bilirubin: 0.4 mg/dL (ref 0.3–1.2)
Total Protein: 6.7 g/dL (ref 6.5–8.1)

## 2019-11-16 LAB — CBC
HCT: 41.4 % (ref 39.0–52.0)
Hemoglobin: 13.1 g/dL (ref 13.0–17.0)
MCH: 28.4 pg (ref 26.0–34.0)
MCHC: 31.6 g/dL (ref 30.0–36.0)
MCV: 89.6 fL (ref 80.0–100.0)
Platelets: 250 10*3/uL (ref 150–400)
RBC: 4.62 MIL/uL (ref 4.22–5.81)
RDW: 13.8 % (ref 11.5–15.5)
WBC: 7 10*3/uL (ref 4.0–10.5)
nRBC: 0 % (ref 0.0–0.2)

## 2019-11-16 LAB — PROTIME-INR
INR: 1.1 (ref 0.8–1.2)
Prothrombin Time: 13.7 seconds (ref 11.4–15.2)

## 2019-11-16 LAB — APTT: aPTT: 24 seconds (ref 24–36)

## 2019-11-16 MED ORDER — ONDANSETRON 4 MG PO TBDP
4.0000 mg | ORAL_TABLET | Freq: Once | ORAL | Status: AC
  Administered 2019-11-17: 01:00:00 4 mg via ORAL
  Filled 2019-11-16: qty 1

## 2019-11-16 MED ORDER — SODIUM CHLORIDE 0.9% FLUSH: 3.0000 mL | Freq: Once | INTRAVENOUS | Status: DC

## 2019-11-16 MED ORDER — ACETAMINOPHEN 500 MG PO TABS
1000.0000 mg | ORAL_TABLET | Freq: Once | ORAL | Status: AC
  Administered 2019-11-17: 1000 mg via ORAL
  Filled 2019-11-16: qty 2

## 2019-11-16 MED ORDER — MECLIZINE HCL 25 MG PO TABS
50.0000 mg | ORAL_TABLET | Freq: Once | ORAL | Status: AC
  Administered 2019-11-17: 01:00:00 50 mg via ORAL
  Filled 2019-11-16: qty 2

## 2019-11-16 NOTE — ED Triage Notes (Signed)
Per EMS, pt was dizzy 16 hours ago, he reports he smoked marijuana and possibly taken a "sleeping pill".  He layed down and got back up and symptoms resolved.  He just want to "know what happened somebody told me I had a stroke."  No neuro deficits noted  178/80 72 HR 96% RA CBG 263

## 2019-11-16 NOTE — ED Provider Notes (Addendum)
TIME SEEN: 11:17 PM  CHIEF COMPLAINT: Dizziness  HPI: Patient is a 64 year old male with history of hypertension, diabetes, stroke with previous left-sided weakness who presents to the emergency department with symptoms of vertigo that started over 16 hours ago.  States symptoms worse with lying flat as well as looking to the left.  He is able to ambulate.  Complaining of mild frontal headache.  No numbness, weakness.  No fevers, cough, vomiting, diarrhea.  No chest pain or shortness of breath.  No hearing loss, tinnitus, ear pain.  States he was concerned he may have had another stroke.  States he has not been on his medications for some time.  He is unable to tell me why he is not taking any medication.   ROS: See HPI Constitutional: no fever  Eyes: no drainage  ENT: no runny nose   Cardiovascular:  no chest pain  Resp: no SOB  GI: no vomiting GU: no dysuria Integumentary: no rash  Allergy: no hives  Musculoskeletal: no leg swelling  Neurological: no slurred speech ROS otherwise negative  PAST MEDICAL HISTORY/PAST SURGICAL HISTORY:  Past Medical History:  Diagnosis Date  . Diabetes mellitus without complication (HCC)   . Hypertension   . Stroke [facility])     MEDICATIONS:  Prior to Admission medications   Medication Sig Start Date End Date Taking? Authorizing Provider  ASPIR-LOW 81 MG EC tablet Take 81 mg by mouth daily. 10/04/16   [provider]  atorvastatin (LIPITOR) 40 MG tablet Take 40 mg by mouth daily. 10/04/16   [provider]  cyclobenzaprine (FLEXERIL) 10 MG tablet Take 10 mg by mouth 3 (three) times daily as needed for muscle spasms.    [provider]  gabapentin (NEURONTIN) 300 MG capsule Take 300 mg by mouth at bedtime. 10/04/16   [provider]  hydrochlorothiazide (HYDRODIURIL) 25 MG tablet Take 25 mg by mouth daily. 10/04/16   [provider]  ibuprofen (ADVIL,MOTRIN) 400 MG tablet Take 1 tablet (400 mg total) by mouth every 6  (six) hours as needed. 12/17/16   Mesner, Barbara Cower, MD  lisinopril (PRINIVIL,ZESTRIL) 20 MG tablet Take 20 mg by mouth daily. 10/04/16   [provider]  metFORMIN (GLUCOPHAGE) 500 MG tablet Take 500 mg by mouth 2 (two) times daily. 10/04/16   [provider]  predniSONE (DELTASONE) 20 MG tablet 3 tabs po daily x 3 days, then 2 tabs x 3 days, then 1.5 tabs x 3 days, then 1 tab x 3 days, then 0.5 tabs x 3 days 12/17/16   Mesner, Barbara Cower, MD    ALLERGIES:  Allergies  Allergen Reactions  . Ciprofloxacin Swelling  . Morphine And Related     "cant sleep"    SOCIAL HISTORY:  Social History   Tobacco Use  . Smoking status: Never Smoker  . Smokeless tobacco: Never Used  Substance Use Topics  . Alcohol use: No    FAMILY HISTORY: No family history on file.  EXAM: BP (!) 176/110 (BP Location: Right Arm)   Pulse 74   Temp 98.8 F (37.1 C) (Oral)   Resp 17   Ht 5\' 7"  (1.702 m)   Wt 88.5 kg   SpO2 99%   BMI 30.54 kg/m  CONSTITUTIONAL: Alert and oriented and responds appropriately to questions. Well-appearing; well-nourished HEAD: Normocephalic EYES: Conjunctivae clear, pupils appear equal, EOM appear intact ENT: normal nose; moist mucous membranes; TMs are clear bilaterally without erythema, purulence, bulging, perforation, effusion.  No cerumen impaction or sign  of foreign body in the external auditory canal. No inflammation, erythema or drainage from the external auditory canal. No signs of mastoiditis. No pain with manipulation of the pinna bilaterally. NECK: Supple, normal ROM CARD: RRR; S1 and S2 appreciated; no murmurs, no clicks, no rubs, no gallops RESP: Normal chest excursion without splinting or tachypnea; breath sounds clear and equal bilaterally; no wheezes, no rhonchi, no rales, no hypoxia or respiratory distress, speaking full sentences ABD/GI: Normal bowel sounds; non-distended; soft, non-tender, no rebound, no guarding, no peritoneal signs, no  hepatosplenomegaly BACK:  The back appears normal EXT: Normal ROM in all joints; no deformity noted, no edema; no cyanosis SKIN: Normal color for age and race; warm; no rash on exposed skin NEURO: Moves all extremities equally; strength 5/5 in all 4 extremities, sensation to light touch intact diffusely, cranial nerves II through XII intact, normal speech, patient becomes more symptomatic when looking to the left but there is no fatigable nystagmus PSYCH: The patient's mood and manner are appropriate.   MEDICAL DECISION MAKING: Patient here with vertigo.  I suspect that symptoms are peripheral in nature.  Symptoms worse with lying flat and looking to the left.  Better with staying still and sitting upright.  Does have risk factors for stroke and has had a previous stroke before.  Outside of TPA window.  Will give meclizine, Zofran, Tylenol for symptomatic relief and reassess.  ED PROGRESS: Patient reports feeling much better.  CT of the head shows no acute abnormality.  Urine shows no sign of infection or dehydration.  I feel patient is safe for discharge home with meclizine, Zofran.  Will give outpatient PCP follow-up as well so he can establish care and get restarted on his home medications that he has been off of for quite some time.  Patient able to ambulate with quick, steady gait without ataxia or need for assistance.  At this time, I do not feel there is any life-threatening condition present. I have reviewed, interpreted and discussed all results (EKG, imaging, lab, urine as appropriate) and exam findings with patient/family. I have reviewed nursing notes and appropriate previous records.  I feel the patient is safe to be discharged home without further emergent workup and can continue workup as an outpatient as needed. Discussed usual and customary return precautions. Patient/family verbalize understanding and are comfortable with this plan.  Outpatient follow-up has been provided as needed.  All questions have been answered.    EKG Interpretation  Date/Time:  Saturday November 16 2019 20:49:55 EST Ventricular Rate:  74 PR Interval:  162 QRS Duration: 86 QT Interval:  388 QTC Calculation: 430 R Axis:   2 Text Interpretation: Normal sinus rhythm Septal infarct , age undetermined Abnormal ECG No old tracing to compare Confirmed by Cornelious Diven, Baxter Hire [phone]) on 11/16/2019 11:04:12 PM          Terry Davidson was evaluated in Emergency Department on 11/16/2019 for the symptoms described in the history of present illness. He was evaluated in the context of the global COVID-19 pandemic, which necessitated consideration that the patient might be at risk for infection with the SARS-CoV-2 virus that causes COVID-19. Institutional protocols and algorithms that pertain to the evaluation of patients at risk for COVID-19 are in a state of rapid change based on information released by regulatory bodies including the CDC and federal and state organizations. These policies and algorithms were followed during the patient's care in the ED.  Patient was seen wearing N95, face shield, gloves.  Raymund Manrique, Delice Bison, DO 11/17/19 0138    Lavayah Vita, Delice Bison, DO 11/17/19 0211

## 2019-11-17 ENCOUNTER — Emergency Department (HOSPITAL_COMMUNITY): Payer: Self-pay

## 2019-11-17 LAB — URINALYSIS, ROUTINE W REFLEX MICROSCOPIC
Bilirubin Urine: NEGATIVE
Glucose, UA: 50 mg/dL — AB
Hgb urine dipstick: NEGATIVE
Ketones, ur: NEGATIVE mg/dL
Leukocytes,Ua: NEGATIVE
Nitrite: NEGATIVE
Protein, ur: NEGATIVE mg/dL
Specific Gravity, Urine: 1.027 (ref 1.005–1.030)
pH: 5 (ref 5.0–8.0)

## 2019-11-17 MED ORDER — ASPIRIN 81 MG PO CHEW: 81.0000 mg | CHEWABLE_TABLET | Freq: Every day | ORAL | 0 refills | Status: AC

## 2019-11-17 MED ORDER — ONDANSETRON 4 MG PO TBDP: 4.0000 mg | ORAL_TABLET | Freq: Four times a day (QID) | ORAL | 0 refills | Status: DC | PRN

## 2019-11-17 MED ORDER — MECLIZINE HCL 25 MG PO TABS: 25.0000 mg | ORAL_TABLET | Freq: Three times a day (TID) | ORAL | 0 refills | Status: AC | PRN

## 2019-11-17 NOTE — Discharge Instructions (Addendum)
Your labs, urine, head CT were reassuring today.  I recommend you establish care with a primary care physician to get restarted on your home medications.  I recommend starting aspirin 81 mg daily given your history of stroke.  Steps to find a Primary Care Provider (PCP):  Call [phone] or [phone] to access "[place] Find a Doctor Service."  2.  You may also go on the [facility] website at [email or web address]  3.  [facility] also frequently accepts new patients.  [facility]  [address] [place] [address] [phone]  4.  There are also multiple [facility], [facility] and [facility] practices throughout the [place] that are frequently accepting new patients. You may find a clinic that is close to your home and contact them.  [facility] [email or web address] [phone]  [facility] [email or web address]  [facility] [email or web address] [phone]  [facility] [email or web address] [phone]  5.  Local Health Departments also can provide primary care services.  [facility]  [address] [place] [address] [phone]  [facility] Department [address] [place] [address] [phone]  [facility] Department [address] [place] [address]  [place] [address] [phone]

## 2021-05-11 ENCOUNTER — Encounter (HOSPITAL_COMMUNITY): Payer: Self-pay

## 2021-05-11 ENCOUNTER — Emergency Department (HOSPITAL_COMMUNITY): Payer: Self-pay

## 2021-05-11 ENCOUNTER — Other Ambulatory Visit: Payer: Self-pay

## 2021-05-11 ENCOUNTER — Emergency Department (HOSPITAL_COMMUNITY)
Admission: EM | Admit: 2021-05-11 | Discharge: 2021-05-11 | Disposition: A | Discharge status: Home / Self Care | Payer: Self-pay | Attending: Emergency Medicine | Admitting: Emergency Medicine

## 2021-05-11 DIAGNOSIS — J189 Pneumonia, unspecified organism: Secondary | ICD-10-CM

## 2021-05-11 DIAGNOSIS — R000 Tachycardia, unspecified: Secondary | ICD-10-CM | POA: Insufficient documentation

## 2021-05-11 DIAGNOSIS — I10 Essential (primary) hypertension: Secondary | ICD-10-CM | POA: Insufficient documentation

## 2021-05-11 DIAGNOSIS — Z7982 Long term (current) use of aspirin: Secondary | ICD-10-CM | POA: Insufficient documentation

## 2021-05-11 DIAGNOSIS — R350 Frequency of micturition: Secondary | ICD-10-CM | POA: Insufficient documentation

## 2021-05-11 DIAGNOSIS — R1013 Epigastric pain: Secondary | ICD-10-CM

## 2021-05-11 DIAGNOSIS — R1084 Generalized abdominal pain: Secondary | ICD-10-CM | POA: Insufficient documentation

## 2021-05-11 DIAGNOSIS — R030 Elevated blood-pressure reading, without diagnosis of hypertension: Secondary | ICD-10-CM

## 2021-05-11 DIAGNOSIS — R519 Headache, unspecified: Secondary | ICD-10-CM | POA: Insufficient documentation

## 2021-05-11 DIAGNOSIS — E119 Type 2 diabetes mellitus without complications: Secondary | ICD-10-CM | POA: Insufficient documentation

## 2021-05-11 DIAGNOSIS — R079 Chest pain, unspecified: Secondary | ICD-10-CM

## 2021-05-11 DIAGNOSIS — K8689 Other specified diseases of pancreas: Secondary | ICD-10-CM

## 2021-05-11 LAB — COMPREHENSIVE METABOLIC PANEL
ALT: 12 U/L (ref 0–44)
AST: 15 U/L (ref 15–41)
Albumin: 3.8 g/dL (ref 3.5–5.0)
Alkaline Phosphatase: 78 U/L (ref 38–126)
Anion gap: 8 (ref 5–15)
BUN: 17 mg/dL (ref 8–23)
CO2: 25 mmol/L (ref 22–32)
Calcium: 9 mg/dL (ref 8.9–10.3)
Chloride: 106 mmol/L (ref 98–111)
Creatinine, Ser: 0.88 mg/dL (ref 0.61–1.24)
GFR, Estimated: >60 mL/min (ref >60)
Glucose, Bld: 167 mg/dL — ABNORMAL HIGH (ref 70–99)
Potassium: 3.6 mmol/L (ref 3.5–5.1)
Sodium: 139 mmol/L (ref 135–145)
Total Bilirubin: 0.8 mg/dL (ref 0.3–1.2)
Total Protein: 6.9 g/dL (ref 6.5–8.1)

## 2021-05-11 LAB — URINALYSIS, ROUTINE W REFLEX MICROSCOPIC
Bilirubin Urine: NEGATIVE
Glucose, UA: NEGATIVE mg/dL
Hgb urine dipstick: NEGATIVE
Ketones, ur: NEGATIVE mg/dL
Leukocytes,Ua: NEGATIVE
Nitrite: NEGATIVE
Protein, ur: NEGATIVE mg/dL
Specific Gravity, Urine: 1.025 (ref 1.005–1.030)
pH: 5 (ref 5.0–8.0)

## 2021-05-11 LAB — CBC
HCT: 38.9 % — ABNORMAL LOW (ref 39.0–52.0)
Hemoglobin: 12.4 g/dL — ABNORMAL LOW (ref 13.0–17.0)
MCH: 28.4 pg (ref 26.0–34.0)
MCHC: 31.9 g/dL (ref 30.0–36.0)
MCV: 89 fL (ref 80.0–100.0)
Platelets: 311 10*3/uL (ref 150–400)
RBC: 4.37 MIL/uL (ref 4.22–5.81)
RDW: 13.6 % (ref 11.5–15.5)
WBC: 5.4 10*3/uL (ref 4.0–10.5)
nRBC: 0 % (ref 0.0–0.2)

## 2021-05-11 LAB — CBG MONITORING, ED: Glucose-Capillary: 174 mg/dL — ABNORMAL HIGH (ref 70–99)

## 2021-05-11 LAB — LIPASE, BLOOD: Lipase: 32 U/L (ref 11–51)

## 2021-05-11 MED ORDER — HYDROCHLOROTHIAZIDE 25 MG PO TABS: 25.0000 mg | ORAL_TABLET | Freq: Every day | ORAL | 0 refills | Status: AC

## 2021-05-11 MED ORDER — SODIUM CHLORIDE 0.9 % IV BOLUS
1000.0000 mL | Freq: Once | INTRAVENOUS | Status: AC
  Administered 2021-05-11: 1000 mL via INTRAVENOUS

## 2021-05-11 MED ORDER — PROCHLORPERAZINE EDISYLATE 10 MG/2ML IJ SOLN
10.0000 mg | Freq: Once | INTRAMUSCULAR | Status: AC
  Administered 2021-05-11: 10 mg via INTRAVENOUS
  Filled 2021-05-11: qty 2

## 2021-05-11 MED ORDER — IOHEXOL 350 MG/ML SOLN
75.0000 mL | Freq: Once | INTRAVENOUS | Status: AC | PRN
  Administered 2021-05-11: 75 mL via INTRAVENOUS

## 2021-05-11 MED ORDER — DIPHENHYDRAMINE HCL 50 MG/ML IJ SOLN
50.0000 mg | Freq: Once | INTRAMUSCULAR | Status: AC
  Administered 2021-05-11: 50 mg via INTRAVENOUS
  Filled 2021-05-11: qty 1

## 2021-05-11 MED ORDER — DOXYCYCLINE HYCLATE 100 MG PO CAPS: 100.0000 mg | ORAL_CAPSULE | Freq: Two times a day (BID) | ORAL | 0 refills | Status: AC

## 2021-05-11 MED ORDER — HYDRALAZINE HCL 20 MG/ML IJ SOLN
10.0000 mg | Freq: Once | INTRAMUSCULAR | Status: AC
  Administered 2021-05-11: 10 mg via INTRAVENOUS
  Filled 2021-05-11: qty 1

## 2021-05-11 NOTE — ED Provider Notes (Signed)
[facility] EMERGENCY DEPARTMENT Provider Note   CSN: [id number] Arrival date & time: 05/11/21  3016     History Chief Complaint  Patient presents with   Urinary Frequency   Abdominal Pain    Terry Davidson is a 65 y.o. male who presents with 3-4 days of polydipsia and polyuria. He reports that he has noticed that he has been feeling more dehydrated recently and has increased the amount of Pepsi he drinks. He reports that he has since noticed he has had to urinate more. Now urinating 3-4 times daily. No pain or burning with urination. No blood in urine. No hx kidney stones. No fever or chills. Appears dehydrated on exam with skin tenting. UA negative for any glucose or ketones, leukocyte esterase or nitrites. Counseling on limiting soda intake.  The history is provided by the patient and medical records. No language interpreter was used.  Urinary Frequency This is a new problem. The current episode started more than 2 days ago. The problem occurs daily. The problem has not changed since onset.Associated symptoms include abdominal pain and headaches. Pertinent negatives include no chest pain and no shortness of breath. Nothing aggravates the symptoms. Nothing relieves the symptoms. He has tried nothing for the symptoms.  Abdominal Pain Pain location:  Generalized Pain quality: dull   Pain radiates to:  Back Pain severity:  Severe Onset quality:  Unable to specify Duration:  3 days Timing:  Constant Chronicity:  New Relieved by:  Nothing Worsened by:  Nothing Ineffective treatments:  None tried Associated symptoms: no chest pain, no constipation, no diarrhea, no hematemesis, no hematochezia, no hematuria, no melena, no nausea, no shortness of breath and no vomiting       Past Medical History:  Diagnosis Date   Diabetes mellitus without complication (HCC)    Hypertension    Stroke [facility])     Patient Active Problem List   Diagnosis Date Noted    Diabetes (HCC) 09/03/2016    Past Surgical History:  Procedure Laterality Date   APPENDECTOMY         No family history on file.  Social History   Tobacco Use   Smoking status: Never   Smokeless tobacco: Never  Substance Use Topics   Alcohol use: No    Home Medications Prior to Admission medications   Medication Sig Start Date End Date Taking? Authorizing Provider  aspirin 81 MG chewable tablet Chew 1 tablet (81 mg total) by mouth daily. 11/17/19   Ward, Layla Maw, DO  meclizine (ANTIVERT) 25 MG tablet Take 1-2 tablets (25-50 mg total) by mouth 3 (three) times daily as needed for dizziness. 11/17/19   Ward, Layla Maw, DO  ondansetron (ZOFRAN ODT) 4 MG disintegrating tablet Take 1 tablet (4 mg total) by mouth every 6 (six) hours as needed. 11/17/19   Ward, Layla Maw, DO    Allergies    Ciprofloxacin and Morphine and related  Review of Systems   Review of Systems  Constitutional: Negative.   Eyes:  Positive for visual disturbance.  Respiratory:  Negative for shortness of breath.   Cardiovascular:  Negative for chest pain.  Gastrointestinal:  Positive for abdominal pain. Negative for constipation, diarrhea, hematemesis, hematochezia, melena, nausea and vomiting.  Endocrine: Positive for polydipsia and polyuria.  Genitourinary:  Positive for frequency. Negative for hematuria.  Neurological:  Positive for headaches.   Physical Exam Updated Vital Signs BP (!) 176/94   Pulse 65   Temp 98.7 F (37.1 C) (Oral)  Resp 18   Ht 5\' 7"  (1.702 m)   Wt 88.5 kg   SpO2 99%   BMI 30.56 kg/m   Physical Exam Constitutional:      General: He is not in acute distress.    Appearance: He is well-developed and normal weight. He is not ill-appearing, toxic-appearing or diaphoretic.  HENT:     Head: Normocephalic and atraumatic.     Mouth/Throat:     Pharynx: Oropharynx is clear.  Cardiovascular:     Rate and Rhythm: Regular rhythm. Bradycardia present.  Pulmonary:     Effort:  Pulmonary effort is normal.     Breath sounds: Normal breath sounds.  Abdominal:     General: Bowel sounds are normal. There is no distension. There are no signs of injury.     Palpations: Abdomen is soft.     Tenderness: There is generalized abdominal tenderness and tenderness in the epigastric area and suprapubic area.  Skin:    General: Skin is warm and dry.  Neurological:     General: No focal deficit present.     Mental Status: He is alert.  Psychiatric:        Mood and Affect: Mood normal.        Behavior: Behavior normal.    ED Results / Procedures / Treatments   Labs (all labs ordered are listed, but only abnormal results are displayed) Labs Reviewed  COMPREHENSIVE METABOLIC PANEL - Abnormal; Notable for the following components:      Result Value   Glucose, Bld 167 (*)    All other components within normal limits  CBC - Abnormal; Notable for the following components:   Hemoglobin 12.4 (*)    HCT 38.9 (*)    All other components within normal limits  CBG MONITORING, ED - Abnormal; Notable for the following components:   Glucose-Capillary 174 (*)    All other components within normal limits  LIPASE, BLOOD  URINALYSIS, ROUTINE W REFLEX MICROSCOPIC    EKG None  Radiology No results found.  Procedures Procedures   Medications Ordered in ED Medications  sodium chloride 0.9 % bolus 1,000 mL (has no administration in time range)  prochlorperazine (COMPAZINE) injection 10 mg (has no administration in time range)  diphenhydrAMINE (BENADRYL) injection 50 mg (has no administration in time range)  hydrALAZINE (APRESOLINE) injection 10 mg (has no administration in time range)    ED Course  I have reviewed the triage vital signs and the nursing notes.  Pertinent labs & imaging results that were available during my care of the patient were reviewed by me and considered in my medical decision making (see chart for details).    MDM Rules/Calculators/A&P                            Terry Davidson is a 65 y.o. male who presents with 3-4 days of polydipsia and polyuria. He reports that he has noticed that he has been feeling more dehydrated recently and has increased the amount of Pepsi he drinks. He reports that he has since noticed he has had to urinate more. No urinating 3-4 times daily. No pain or burning with urination. No blood in urine. No hx kidney stones. No fever. Appears dehydrated on exam with skin tenting. UA negative for any glucose or ketones, leukocyte esterase or nitrites. Counseling on limiting soda intake.  Patient has a history of hypertension. Has not been taking any medication for several years.  BP 180's systolic. Complaining of headache for several days as well as recent vision changes. Endorsing abdominal pain for past few weeks. 8/10. No hx of AAA.  Abdomen was diffusely tender to palpation of exam with most tender in epigastric region.  Lipase negative. CT angio CAP ordered r/o AAA and returned negative. CT head w contrast ordered to further evaluate headaches and vision  changes in the setting of elevated blood pressure which returned negative for  acute abnormalities. EKG also negative for ST changes. Given compazine for HA and hydralazine for BP. Possible pneumonia also noted on CT imaging. Patient endorsing cough and sputum production. Given prescription for PO doxy. Lastly, pancreatic duct dilation was noted on CT. Patient will need outpatient PCP follow up and imaging  Final Clinical Impression(s) / ED Diagnoses Final diagnoses:  None    Rx / DC Orders ED Discharge Orders     None        Adron Bene, MD 05/11/21 2125    Tegeler, Canary Brim, MD 05/14/21 0100

## 2021-05-11 NOTE — Discharge Instructions (Addendum)
Your history, exam, work-up today are consistent with a pneumonia likely causing some of your discomfort.  The CT scan did not show any concerning findings related to your aorta however, it did reveal the pneumonia which fits with your coughing and symptoms.  It also discovered some abnormalities with your pancreas which it is recommended to have you follow-up with an outpatient PCP and have outpatient imaging.  Please rest and stay hydrated given your living situation and the heat.  We suspect some dehydration also contributed to your fatigue and headaches today.  As your symptoms have improved, we feel you are safe for discharge home.  If any symptoms change or worsen acutely, please return to the nearest emergency department.   [facility] [place] [employer] [facility] [phone] Call for shelter availability.  You must have access to a phone so they can call you back.  [facility]) [address] [place] [phone] M-F 8:00-3:00; S-S 8:00-2:00  Area Shelters [facility] (Men and Women) [address] [phone]  [facility] (Men and Women) [address] [place] [phone]  [employer] (Domestic Violence Shelter) [address] [place] [phone]  [facility] (Men) [address] [employer] [phone]  [employer] (Single women and women with children) [address] [place] [phone]

## 2021-05-11 NOTE — ED Provider Notes (Signed)
Emergency Medicine Provider Triage Evaluation Note  Terry Davidson , a 65 y.o. male  was evaluated in triage.  Pt complains of urinary frequency, suprapubic pain, and polydipsia for the past few days. PT reports hx of diabetes however has not been on medications for several years as he took himself off of it. No fevers, chills, nausea, vomiting, dysuria, hematuria, flank pain.  Review of Systems  Positive: + polyuria, polydipsia Negative: - nausea, vomiting, flank pain  Physical Exam  BP (!) 174/101   Pulse (!) 46   Temp 98.1 F (36.7 C) (Oral)   Resp 16   Ht 5\' 7"  (1.702 m)   Wt 88.5 kg   SpO2 99%   BMI 30.56 kg/m  Gen:   Awake, no distress   Resp:  Normal effort  MSK:   Moves extremities without difficulty  Other:  Mild suprapubic TTP. NO flank TTP  Medical Decision Making  Medically screening exam initiated at 9:03 AM.  Appropriate orders placed.  Terry Davidson was informed that the remainder of the evaluation will be completed by another provider, this initial triage assessment does not replace that evaluation, and the importance of remaining in the ED until their evaluation is complete.  CBG 174. Labs and U/A ordered.    Deatra Canter, PA-C 05/11/21 05/13/21    [id number], MD 05/11/21 [phone]

## 2021-05-11 NOTE — ED Triage Notes (Signed)
Pt reports urinary frequency for the last 4 days, also c.o lower abd pain, denies back pain or hematuria. Pt states his is diabetic when asked but is not taking any medications for it. Pt denies n/v

## 2021-05-11 NOTE — Social Work (Signed)
CSW met with Pt at bedside. Pt reports that he has been living in a tent for several years. Pt is familiar with several area shelters and has received services at both the [facility] and [facility]. Pt states that he will go back to the [facility] to see NP for primary care. CSW discussed [employer] with Pt, Pt states he has had [employer] in the past and he does not wish to sign up for it again.  CSW reminded Pt that he can obtain lunch at [facility] daily, encouraged PT to return to [facility] to get on list for shelter bed.  Homelessness resources and [facility] resources added to AVS.

## 2021-05-11 NOTE — ED Notes (Signed)
CSW at bedside.

## 2021-06-08 ENCOUNTER — Encounter (HOSPITAL_COMMUNITY): Payer: Self-pay | Admitting: Emergency Medicine

## 2021-06-08 ENCOUNTER — Emergency Department (HOSPITAL_COMMUNITY): Payer: Self-pay

## 2021-06-08 ENCOUNTER — Other Ambulatory Visit: Payer: Self-pay

## 2021-06-08 ENCOUNTER — Emergency Department (HOSPITAL_COMMUNITY)
Admission: EM | Admit: 2021-06-08 | Discharge: 2021-06-08 | Disposition: A | Discharge status: Home / Self Care | Payer: Self-pay | Attending: Emergency Medicine | Admitting: Emergency Medicine

## 2021-06-08 DIAGNOSIS — M79621 Pain in right upper arm: Secondary | ICD-10-CM | POA: Insufficient documentation

## 2021-06-08 DIAGNOSIS — M79601 Pain in right arm: Secondary | ICD-10-CM

## 2021-06-08 DIAGNOSIS — Z79899 Other long term (current) drug therapy: Secondary | ICD-10-CM | POA: Insufficient documentation

## 2021-06-08 DIAGNOSIS — I10 Essential (primary) hypertension: Secondary | ICD-10-CM | POA: Insufficient documentation

## 2021-06-08 DIAGNOSIS — M25511 Pain in right shoulder: Secondary | ICD-10-CM | POA: Insufficient documentation

## 2021-06-08 DIAGNOSIS — E119 Type 2 diabetes mellitus without complications: Secondary | ICD-10-CM | POA: Insufficient documentation

## 2021-06-08 DIAGNOSIS — M898X1 Other specified disorders of bone, shoulder: Secondary | ICD-10-CM

## 2021-06-08 DIAGNOSIS — R079 Chest pain, unspecified: Secondary | ICD-10-CM

## 2021-06-08 DIAGNOSIS — Z7982 Long term (current) use of aspirin: Secondary | ICD-10-CM | POA: Insufficient documentation

## 2021-06-08 DIAGNOSIS — R0789 Other chest pain: Secondary | ICD-10-CM | POA: Insufficient documentation

## 2021-06-08 LAB — CBC
HCT: 39.3 % (ref 39.0–52.0)
Hemoglobin: 12.8 g/dL — ABNORMAL LOW (ref 13.0–17.0)
MCH: 28.5 pg (ref 26.0–34.0)
MCHC: 32.6 g/dL (ref 30.0–36.0)
MCV: 87.5 fL (ref 80.0–100.0)
Platelets: 259 10*3/uL (ref 150–400)
RBC: 4.49 MIL/uL (ref 4.22–5.81)
RDW: 13.4 % (ref 11.5–15.5)
WBC: 6.9 10*3/uL (ref 4.0–10.5)
nRBC: 0 % (ref 0.0–0.2)

## 2021-06-08 LAB — TROPONIN I (HIGH SENSITIVITY): Troponin I (High Sensitivity): 6 ng/L (ref <18)

## 2021-06-08 LAB — BASIC METABOLIC PANEL
Anion gap: 10 (ref 5–15)
BUN: 20 mg/dL (ref 8–23)
CO2: 22 mmol/L (ref 22–32)
Calcium: 9.4 mg/dL (ref 8.9–10.3)
Chloride: 104 mmol/L (ref 98–111)
Creatinine, Ser: 0.96 mg/dL (ref 0.61–1.24)
GFR, Estimated: >60 mL/min (ref >60)
Glucose, Bld: 144 mg/dL — ABNORMAL HIGH (ref 70–99)
Potassium: 4.3 mmol/L (ref 3.5–5.1)
Sodium: 136 mmol/L (ref 135–145)

## 2021-06-08 MED ORDER — IBUPROFEN 800 MG PO TABS: 800.0000 mg | ORAL_TABLET | Freq: Three times a day (TID) | ORAL | 0 refills | Status: DC

## 2021-06-08 MED ORDER — KETOROLAC TROMETHAMINE 60 MG/2ML IM SOLN
30.0000 mg | Freq: Once | INTRAMUSCULAR | Status: AC
  Administered 2021-06-08: 30 mg via INTRAMUSCULAR
  Filled 2021-06-08: qty 2

## 2021-06-08 MED ORDER — LIDOCAINE 5 % EX PTCH: 1.0000 | MEDICATED_PATCH | CUTANEOUS | 0 refills | Status: DC

## 2021-06-08 MED ORDER — LIDOCAINE 5 % EX PTCH: 1.0000 | MEDICATED_PATCH | CUTANEOUS | 0 refills | Status: AC

## 2021-06-08 MED ORDER — METHOCARBAMOL 500 MG PO TABS: 500.0000 mg | ORAL_TABLET | Freq: Two times a day (BID) | ORAL | 0 refills | Status: DC

## 2021-06-08 MED ORDER — LIDOCAINE 5 % EX PTCH
1.0000 | MEDICATED_PATCH | Freq: Once | CUTANEOUS | Status: DC
  Administered 2021-06-08: 1 via TRANSDERMAL
  Filled 2021-06-08: qty 1

## 2021-06-08 MED ORDER — HYDROCHLOROTHIAZIDE 25 MG PO TABS
25.0000 mg | ORAL_TABLET | Freq: Once | ORAL | Status: AC
  Administered 2021-06-08: 25 mg via ORAL
  Filled 2021-06-08: qty 1

## 2021-06-08 MED ORDER — METHOCARBAMOL 500 MG PO TABS
500.0000 mg | ORAL_TABLET | Freq: Once | ORAL | Status: AC
  Administered 2021-06-08: 500 mg via ORAL
  Filled 2021-06-08: qty 1

## 2021-06-08 NOTE — ED Provider Notes (Signed)
Emergency Medicine Provider Triage Evaluation Note  Terry Davidson , a 65 y.o. male  was evaluated in triage.  Pt complains of right shoulder pain that radiates to the right arm and right chest.  Review of Systems  Positive: Right arm/shoulder pain, right chest pain Negative: Chest pain  Physical Exam  BP (!) 187/85 (BP Location: Left Arm)   Pulse (!) 55   Temp 98.6 F (37 C) (Oral)   Resp 16   SpO2 99%  Gen:   Awake, no distress   Resp:  Normal effort  MSK:   Moves extremities without difficulty  Other:  TTP inferior to the right scapula  Medical Decision Making  Medically screening exam initiated at 1:01 PM.  Appropriate orders placed.  Terry Davidson was informed that the remainder of the evaluation will be completed by another provider, this initial triage assessment does not replace that evaluation, and the importance of remaining in the ED until their evaluation is complete.     Rayne Du 06/08/21 1302    Terrilee Files, MD 06/08/21 [phone]

## 2021-06-08 NOTE — Discharge Instructions (Addendum)
-  Take motrin for moderate pain -Take robaxin (muscle relaxer) for severe pain. Do not operate heavy machinery or drive while taking this medication. -Lidoderm pain patches can be applied directly to shoulder blade and can stay on for 24 hours. -Light stretching -No heavy lifting

## 2021-06-08 NOTE — ED Triage Notes (Signed)
Pt complains of chest pain thatt started yesterday. Pain starts in right upper back and moves across chest. Denies n/v. Denies any injury

## 2021-06-08 NOTE — ED Provider Notes (Signed)
[facility] EMERGENCY DEPARTMENT Provider Note   CSN: [id number] Arrival date & time: 06/08/21  1223     History Chief Complaint  Patient presents with   Chest Pain    Terry Davidson is a 65 y.o. male.  Patient is a 65 yo male presenting for right shoulder blade pain that started 1-3 days ago, first noticed upon waking. Denies hx of falls or trauma. States pain is radiating to right shoulder and right chest wall. Denies fevers, chills, or coughing. Denies chest pain or sob. Denies rashes.   The history is provided by the patient. No language interpreter was used.  Chest Pain Associated symptoms: no abdominal pain, no back pain, no cough, no fever, no palpitations, no shortness of breath and no vomiting       Past Medical History:  Diagnosis Date   Diabetes mellitus without complication (HCC)    Hypertension    Stroke [facility])     Patient Active Problem List   Diagnosis Date Noted   Diabetes (HCC) 09/03/2016    Past Surgical History:  Procedure Laterality Date   APPENDECTOMY         No family history on file.  Social History   Tobacco Use   Smoking status: Never   Smokeless tobacco: Never  Substance Use Topics   Alcohol use: No   Drug use: Yes    Types: Marijuana    Home Medications Prior to Admission medications   Medication Sig Start Date End Date Taking? Authorizing Provider  aspirin 81 MG chewable tablet Chew 1 tablet (81 mg total) by mouth daily. 11/17/19   Ward, Layla Maw, DO  hydrochlorothiazide (HYDRODIURIL) 25 MG tablet Take 1 tablet (25 mg total) by mouth daily. 05/11/21   Tegeler, Canary Brim, MD  meclizine (ANTIVERT) 25 MG tablet Take 1-2 tablets (25-50 mg total) by mouth 3 (three) times daily as needed for dizziness. 11/17/19   Ward, Layla Maw, DO  ondansetron (ZOFRAN ODT) 4 MG disintegrating tablet Take 1 tablet (4 mg total) by mouth every 6 (six) hours as needed. 11/17/19   Ward, Layla Maw, DO    Allergies     Ciprofloxacin and Morphine and related  Review of Systems   Review of Systems  Constitutional:  Negative for chills and fever.  HENT:  Negative for ear pain and sore throat.   Eyes:  Negative for pain and visual disturbance.  Respiratory:  Negative for cough and shortness of breath.   Cardiovascular:  Positive for chest pain. Negative for palpitations.  Gastrointestinal:  Negative for abdominal pain and vomiting.  Genitourinary:  Negative for dysuria and hematuria.  Musculoskeletal:  Negative for arthralgias and back pain.       Right shoulder blade and right shoulder pain  Skin:  Negative for color change and rash.  Neurological:  Negative for seizures and syncope.  All other systems reviewed and are negative.  Physical Exam Updated Vital Signs BP (!) 187/80   Pulse (!) 53   Temp 98.6 F (37 C) (Oral)   Resp 20   SpO2 98%   Physical Exam Vitals and nursing note reviewed.  Constitutional:      Appearance: He is well-developed.  HENT:     Head: Normocephalic and atraumatic.  Eyes:     Conjunctiva/sclera: Conjunctivae normal.  Cardiovascular:     Rate and Rhythm: Normal rate and regular rhythm.     Heart sounds: No murmur heard. Pulmonary:     Effort: Pulmonary effort is  normal. No respiratory distress.     Breath sounds: Normal breath sounds.  Abdominal:     Palpations: Abdomen is soft.     Tenderness: There is no abdominal tenderness.  Musculoskeletal:     Cervical back: Neck supple.  Skin:    General: Skin is warm and dry.  Neurological:     Mental Status: He is alert.    ED Results / Procedures / Treatments   Labs (all labs ordered are listed, but only abnormal results are displayed) Labs Reviewed  BASIC METABOLIC PANEL - Abnormal; Notable for the following components:      Result Value   Glucose, Bld 144 (*)    All other components within normal limits  CBC - Abnormal; Notable for the following components:   Hemoglobin 12.8 (*)    All other components  within normal limits  TROPONIN I (HIGH SENSITIVITY)  TROPONIN I (HIGH SENSITIVITY)    EKG None  Radiology DG Chest 1 View  Result Date: 06/08/2021 CLINICAL DATA:  Chest pain since yesterday. EXAM: CHEST  1 VIEW COMPARISON:  Chest CT 05/14/2021 FINDINGS: The cardiac silhouette, mediastinal and hilar contours are normal. The lungs are clear. No infiltrates, edema or effusions. No pulmonary lesions. The bony thorax is intact. IMPRESSION: No acute cardiopulmonary findings. Electronically Signed   By: Rudie Meyer M.D.   On: 06/08/2021 13:43    Procedures Procedures   Medications Ordered in ED Medications  methocarbamol (ROBAXIN) tablet 500 mg (has no administration in time range)  ketorolac (TORADOL) injection 30 mg (has no administration in time range)  lidocaine (LIDODERM) 5 % 1 patch (has no administration in time range)    ED Course  I have reviewed the triage vital signs and the nursing notes.  Pertinent labs & imaging results that were available during my care of the patient were reviewed by me and considered in my medical decision making (see chart for details).    MDM Rules/Calculators/A&P                           10:39 PM 65 yo male presenting for right shoulder blade pain that started 1-3 days ago, first noticed upon waking. Denies hx of falls or trauma. Patient is Aox3, no acute distress, afebrile, with stable vitals. Physical exam demonstrates tenderness to palpation of right shoulder blade. No evidence of trauma, no ecchymosis, no rashes.   Stable EKG with no ST segment elevation or depression. Stable troponin, electrolytes, and cxr.   Patient's pain likely musculoskeletal in nature due to it's significant reproducibility. Motrin, robaxin, and Lidoderm pain patch given.  Patient in no distress and overall condition improved here in the ED. Medications for pain control sent to pharmacy. Detailed discussions were had with the patient regarding current findings, and  need for close f/u with PCP or on call doctor. The patient has been instructed to return immediately if the symptoms worsen in any way for re-evaluation. Patient verbalized understanding and is in agreement with current care plan. All questions answered prior to discharge.         Final Clinical Impression(s) / ED Diagnoses Final diagnoses:  Shoulder blade pain  Musculoskeletal pain of right upper extremity    Rx / DC Orders ED Discharge Orders     None        Franne Forts, DO 06/08/21 2250

## 2021-06-13 ENCOUNTER — Encounter (HOSPITAL_COMMUNITY): Payer: Self-pay

## 2021-06-13 ENCOUNTER — Emergency Department (HOSPITAL_COMMUNITY)
Admission: EM | Admit: 2021-06-13 | Discharge: 2021-06-14 | Disposition: A | Discharge status: Home / Self Care | Payer: Self-pay | Attending: Emergency Medicine | Admitting: Emergency Medicine

## 2021-06-13 ENCOUNTER — Other Ambulatory Visit: Payer: Self-pay

## 2021-06-13 DIAGNOSIS — E119 Type 2 diabetes mellitus without complications: Secondary | ICD-10-CM | POA: Insufficient documentation

## 2021-06-13 DIAGNOSIS — I10 Essential (primary) hypertension: Secondary | ICD-10-CM | POA: Insufficient documentation

## 2021-06-13 DIAGNOSIS — Z7982 Long term (current) use of aspirin: Secondary | ICD-10-CM | POA: Insufficient documentation

## 2021-06-13 DIAGNOSIS — Z79899 Other long term (current) drug therapy: Secondary | ICD-10-CM | POA: Insufficient documentation

## 2021-06-13 DIAGNOSIS — M62838 Other muscle spasm: Secondary | ICD-10-CM | POA: Insufficient documentation

## 2021-06-13 DIAGNOSIS — Z5900 Homelessness unspecified: Secondary | ICD-10-CM | POA: Insufficient documentation

## 2021-06-13 DIAGNOSIS — G540 Brachial plexus disorders: Secondary | ICD-10-CM | POA: Insufficient documentation

## 2021-06-13 NOTE — ED Provider Notes (Signed)
Emergency Medicine Provider Triage Evaluation Note  Terry Davidson , a 65 y.o. male  was evaluated in triage.  Pt complains of right arm pain and paresthesias and decreased sensation for approximately 1 week.  States over the past 2 days he has noticed some more burning discomfort in his right arm.  Seems to affect his entire arm from the shoulder down he also endorses some neck pain but denies any trauma.  Denies any weakness in any extremities denies any slurred speech confusion head or neck trauma.  Denies any chest pain shortness of breath lightheadedness or dizziness nausea vomiting fevers or chills  Review of Systems  Positive: R arm pain, numbness Negative: Fever  Physical Exam  There were no vitals taken for this visit. Gen:   Awake, no distress, sitting in bed without any evidence of discomfort Resp:  Normal effort  MSK:   Moves extremities without difficulty  Other:  Seems to have decreased sensation in right upper extremity with nailbed pressure more tolerable in right upper extremity than left. Sensation intact in bilateral lower extremities.  No midline cervical spine tenderness palpation no step-off or deformity.  Medical Decision Making  Medically screening exam initiated at 8:37 PM.  Appropriate orders placed.  Dolores Kealan Buchan was informed that the remainder of the evaluation will be completed by another provider, this initial triage assessment does not replace that evaluation, and the importance of remaining in the ED until their evaluation is complete.  Patient symptoms seem to be worse in the morning this is an ongoing subacute/chronic issue.    Gailen Shelter, [place] 06/13/21 2053    Terrilee Files, MD 06/14/21 1027

## 2021-06-13 NOTE — ED Triage Notes (Signed)
Pt states that he has been having R shoulder pain and arm pain for the past few days, denies CP at this time.

## 2021-06-14 MED ORDER — KETOROLAC TROMETHAMINE 60 MG/2ML IM SOLN
30.0000 mg | Freq: Once | INTRAMUSCULAR | Status: AC
  Administered 2021-06-14: 30 mg via INTRAMUSCULAR
  Filled 2021-06-14: qty 2

## 2021-06-14 NOTE — Discharge Instructions (Addendum)
You may use over-the-counter Motrin (Ibuprofen), Acetaminophen (Tylenol), topical muscle creams such as SalonPas, Icy Hot, Bengay, etc. Please stretch, apply ice or heat (whichever helps), and have massage therapy for additional assistance.  

## 2021-06-14 NOTE — ED Provider Notes (Signed)
[facility] EMERGENCY DEPARTMENT Provider Note  CSN: [id number] Arrival date & time: 06/13/21 2023  Chief Complaint(s) Arm Pain  HPI Terry Davidson is a 65 y.o. male with a past medical history listed below here for 1 to 2 weeks of right shoulder pain described as aching/throbbing.  Worse with movement and palpation of the parascapular musculature.  Pain radiating down to the right arm and up the right side of his neck.  He has paresthesias including numbness and tingling in the ulnar portion of the forearm and hand.  He denies any falls or trauma.  Reports that he is homeless and sleeps in the woods on an air mattress.  Typically sleeps on his right side.   He denies any headaches, visual disturbance.  No focal weakness.  No chest pain or shortness of breath.     Arm Pain   Past Medical History Past Medical History:  Diagnosis Date   Diabetes mellitus without complication (HCC)    Hypertension    Stroke [facility])    Patient Active Problem List   Diagnosis Date Noted   Diabetes (HCC) 09/03/2016   Home Medication(s) Prior to Admission medications   Medication Sig Start Date End Date Taking? Authorizing Provider  hydrochlorothiazide (HYDRODIURIL) 25 MG tablet Take 1 tablet (25 mg total) by mouth daily. 05/11/21  Yes Tegeler, Canary Brim, MD  aspirin 81 MG chewable tablet Chew 1 tablet (81 mg total) by mouth daily. Patient not taking: Reported on 06/14/2021 11/17/19   Ward, Layla Maw, DO  ibuprofen (ADVIL) 800 MG tablet Take 1 tablet (800 mg total) by mouth 3 (three) times daily. Patient not taking: Reported on 06/14/2021 06/08/21   Edwin Dada P, DO  lidocaine (LIDODERM) 5 % Place 1 patch onto the skin daily. Remove & Discard patch within 12 hours or as directed by MD Patient not taking: Reported on 06/14/2021 06/08/21   Edwin Dada P, DO  meclizine (ANTIVERT) 25 MG tablet Take 1-2 tablets (25-50 mg total) by mouth 3 (three) times daily as needed for  dizziness. Patient not taking: Reported on 06/14/2021 11/17/19   Ward, Layla Maw, DO  methocarbamol (ROBAXIN) 500 MG tablet Take 1 tablet (500 mg total) by mouth 2 (two) times daily. Patient not taking: Reported on 06/14/2021 06/08/21   Edwin Dada P, DO  ondansetron (ZOFRAN ODT) 4 MG disintegrating tablet Take 1 tablet (4 mg total) by mouth every 6 (six) hours as needed. Patient not taking: Reported on 06/14/2021 11/17/19   Ward, Layla Maw, DO                                                                                                                                    Past Surgical History Past Surgical History:  Procedure Laterality Date   APPENDECTOMY     Family History No family history on file.  Social History Social History   Tobacco Use   Smoking  status: Never   Smokeless tobacco: Never  Substance Use Topics   Alcohol use: No   Drug use: Yes    Types: Marijuana   Allergies Ciprofloxacin and Morphine and related  Review of Systems Review of Systems All other systems are reviewed and are negative for acute change except as noted in the HPI  Physical Exam Vital Signs  I have reviewed the triage vital signs BP (!) 190/76 (BP Location: Left Arm)   Pulse (!) 48   Temp 97.8 F (36.6 C)   Resp 16   SpO2 100%   Physical Exam Vitals reviewed.  Constitutional:      General: He is not in acute distress.    Appearance: He is well-developed. He is not diaphoretic.  HENT:     Head: Normocephalic and atraumatic.     Nose: Nose normal.  Eyes:     General: No scleral icterus.       Right eye: No discharge.        Left eye: No discharge.     Conjunctiva/sclera: Conjunctivae normal.     Pupils: Pupils are equal, round, and reactive to light.  Neck:   Cardiovascular:     Rate and Rhythm: Normal rate and regular rhythm.     Heart sounds: No murmur heard.   No friction rub. No gallop.  Pulmonary:     Effort: Pulmonary effort is normal. No respiratory distress.      Breath sounds: Normal breath sounds. No stridor. No rales.  Abdominal:     General: There is no distension.     Palpations: Abdomen is soft.     Tenderness: There is no abdominal tenderness.  Musculoskeletal:     Cervical back: Normal range of motion and neck supple. Spasms and tenderness present. No rigidity, torticollis or bony tenderness. Muscular tenderness present. No spinous process tenderness.     Thoracic back: Spasms and tenderness present. No bony tenderness.       Back:  Skin:    General: Skin is warm and dry.     Findings: No erythema or rash.  Neurological:     Mental Status: He is alert and oriented to person, place, and time.     Comments: Mental Status:  Alert and oriented to person, place, and time.  Attention and concentration normal.  Speech clear.  Recent memory is intact  Cranial Nerves:  II Visual Fields: Intact to confrontation. Visual fields intact. III, IV, VI: Pupils equal and reactive to light and near. Full eye movement without nystagmus  V Facial Sensation: Normal. No weakness of masticatory muscles  VII: No facial weakness or asymmetry  VIII Auditory Acuity: Grossly normal  IX/X: The uvula is midline; the palate elevates symmetrically  XI: Normal sternocleidomastoid and trapezius strength  XII: The tongue is midline. No atrophy or fasciculations.   Motor System: Muscle Strength: 5/5 and symmetric in the upper and lower extremities. Limited on RUE due to shoulder pain. Decreased grip strength on right hand (4th and 5th digits). No pronation or drift.  Muscle Tone: Tone and muscle bulk are normal in the upper and lower extremities.  Reflexes: DTRs: 1+ and symmetrical in all four extremities. No Clonus Coordination: No tremor.  Sensation: decreased sensation to ulnar aspect of right forearm and hand Gait: Routine gait normal.     ED Results and Treatments Labs (all labs ordered are listed, but only abnormal results are displayed) Labs Reviewed -  No data to display  EKG  EKG Interpretation  Date/Time:    Ventricular Rate:    PR Interval:    QRS Duration:   QT Interval:    QTC Calculation:   R Axis:     Text Interpretation:         Radiology No results found.  Pertinent labs & imaging results that were available during my care of the patient were reviewed by me and considered in my medical decision making (see MDM for details).  Medications Ordered in ED Medications  ketorolac (TORADOL) injection 30 mg (30 mg Intramuscular Given 06/14/21 0120)                                                                                                                                     Procedures Procedures  (including critical care time)  Medical Decision Making / ED Course I have reviewed the nursing notes for this encounter and the patient's prior records (if available in EHR or on provided paperwork).  Terry Davidson was evaluated in Emergency Department on 06/14/2021 for the symptoms described in the history of present illness. He was evaluated in the context of the global COVID-19 pandemic, which necessitated consideration that the patient might be at risk for infection with the SARS-CoV-2 virus that causes COVID-19. Institutional protocols and algorithms that pertain to the evaluation of patients at risk for COVID-19 are in a state of rapid change based on information released by regulatory bodies including the CDC and federal and state organizations. These policies and algorithms were followed during the patient's care in the ED.     Patient presents with right shoulder and right arm pain for 1 to 2 weeks. Presentation is most consistent with thoracic outlet syndrome from muscle spasm of the right shoulder girdle musculature.  He has evidence of ulnar nerve impingement/palsy.  No other focal deficits noted  on exam concerning for CVA.  Patient has had recent imaging of his chest without Pancoast tumor.  Provided with Toradol for pain. Recommended Neurology follow up  Final Clinical Impression(s) / ED Diagnoses Final diagnoses:  Muscle spasm of right shoulder  Thoracic outlet syndrome   The patient appears reasonably screened and/or stabilized for discharge and I doubt any other medical condition or other [facility] requiring further screening, evaluation, or treatment in the ED at this time prior to discharge. Safe for discharge with strict return precautions.  Disposition: Discharge  Condition: Good  I have discussed the results, Dx and Tx plan with the patient/family who expressed understanding and agree(s) with the plan. Discharge instructions discussed at length. The patient/family was given strict return precautions who verbalized understanding of the instructions. No further questions at time of discharge.    ED Discharge Orders     None       Follow Up: [facility] [address]     [address] [place] [address] [phone] Call  to schedule  an appointment for close follow up     This chart was dictated using voice recognition software.  Despite best efforts to proofread,  errors can occur which can change the documentation meaning.    Nira Conn, MD 06/14/21 [phone]

## 2021-06-28 ENCOUNTER — Encounter (HOSPITAL_COMMUNITY): Payer: Self-pay | Admitting: Radiology

## 2021-09-05 ENCOUNTER — Other Ambulatory Visit: Payer: Self-pay

## 2021-09-05 ENCOUNTER — Emergency Department (HOSPITAL_COMMUNITY)
Admission: EM | Admit: 2021-09-05 | Discharge: 2021-09-05 | Disposition: A | Discharge status: Home / Self Care | Payer: Self-pay | Attending: Emergency Medicine | Admitting: Emergency Medicine

## 2021-09-05 ENCOUNTER — Emergency Department (HOSPITAL_COMMUNITY): Payer: Self-pay

## 2021-09-05 ENCOUNTER — Encounter (HOSPITAL_COMMUNITY): Payer: Self-pay | Admitting: Emergency Medicine

## 2021-09-05 DIAGNOSIS — M5030 Other cervical disc degeneration, unspecified cervical region: Secondary | ICD-10-CM | POA: Insufficient documentation

## 2021-09-05 DIAGNOSIS — I10 Essential (primary) hypertension: Secondary | ICD-10-CM | POA: Insufficient documentation

## 2021-09-05 DIAGNOSIS — E119 Type 2 diabetes mellitus without complications: Secondary | ICD-10-CM | POA: Insufficient documentation

## 2021-09-05 DIAGNOSIS — M4802 Spinal stenosis, cervical region: Secondary | ICD-10-CM | POA: Insufficient documentation

## 2021-09-05 DIAGNOSIS — M5412 Radiculopathy, cervical region: Secondary | ICD-10-CM | POA: Insufficient documentation

## 2021-09-05 LAB — COMPREHENSIVE METABOLIC PANEL
ALT: 24 U/L (ref 0–44)
AST: 19 U/L (ref 15–41)
Albumin: 4.3 g/dL (ref 3.5–5.0)
Alkaline Phosphatase: 77 U/L (ref 38–126)
Anion gap: 8 (ref 5–15)
BUN: 20 mg/dL (ref 8–23)
CO2: 24 mmol/L (ref 22–32)
Calcium: 9.3 mg/dL (ref 8.9–10.3)
Chloride: 103 mmol/L (ref 98–111)
Creatinine, Ser: 0.88 mg/dL (ref 0.61–1.24)
GFR, Estimated: >60 mL/min (ref >60)
Glucose, Bld: 139 mg/dL — ABNORMAL HIGH (ref 70–99)
Potassium: 4.1 mmol/L (ref 3.5–5.1)
Sodium: 135 mmol/L (ref 135–145)
Total Bilirubin: 0.6 mg/dL (ref 0.3–1.2)
Total Protein: 7.4 g/dL (ref 6.5–8.1)

## 2021-09-05 LAB — CBC WITH DIFFERENTIAL/PLATELET
Abs Immature Granulocytes: 0.01 10*3/uL (ref 0.00–0.07)
Basophils Absolute: 0 10*3/uL (ref 0.0–0.1)
Basophils Relative: 1 %
Eosinophils Absolute: 0.2 10*3/uL (ref 0.0–0.5)
Eosinophils Relative: 4 %
HCT: 42.8 % (ref 39.0–52.0)
Hemoglobin: 14.1 g/dL (ref 13.0–17.0)
Immature Granulocytes: 0 %
Lymphocytes Relative: 35 %
Lymphs Abs: 2.2 10*3/uL (ref 0.7–4.0)
MCH: 28.8 pg (ref 26.0–34.0)
MCHC: 32.9 g/dL (ref 30.0–36.0)
MCV: 87.3 fL (ref 80.0–100.0)
Monocytes Absolute: 0.4 10*3/uL (ref 0.1–1.0)
Monocytes Relative: 6 %
Neutro Abs: 3.4 10*3/uL (ref 1.7–7.7)
Neutrophils Relative %: 54 %
Platelets: 229 10*3/uL (ref 150–400)
RBC: 4.9 MIL/uL (ref 4.22–5.81)
RDW: 12.8 % (ref 11.5–15.5)
WBC: 6.2 10*3/uL (ref 4.0–10.5)
nRBC: 0 % (ref 0.0–0.2)

## 2021-09-05 MED ORDER — METHOCARBAMOL 500 MG PO TABS: 500.0000 mg | ORAL_TABLET | Freq: Two times a day (BID) | ORAL | 0 refills | Status: AC

## 2021-09-05 MED ORDER — METHYLPREDNISOLONE 4 MG PO TBPK: ORAL_TABLET | ORAL | 0 refills | Status: AC

## 2021-09-05 MED ORDER — GABAPENTIN 100 MG PO CAPS: 100.0000 mg | ORAL_CAPSULE | Freq: Three times a day (TID) | ORAL | 0 refills | Status: AC

## 2021-09-05 NOTE — ED Notes (Signed)
Patient transported to MRI 

## 2021-09-05 NOTE — ED Provider Notes (Signed)
Emergency Medicine Provider Triage Evaluation Note  Terry Davidson , a 65 y.o. male  was evaluated in triage.  Pt complains of pain, numbness and tingling to the right arm.  He states that this is been ongoing for 3 months.  He states that he usually sleeps on his right side.  He states that he has numbness and tingling to the ulnar aspect of the right arm.  He also complains of pain to the right shoulder blade and right trapezius.  He denies any injuries or trauma to the arm.  He denies chest pain or shortness of breath.  He denies any swelling, redness or fevers.  Review of Systems  Positive: See above Negative:   Physical Exam  BP (!) 179/86 (BP Location: Right Arm)   Pulse 62   Temp 98.7 F (37.1 C) (Oral)   Resp 18   SpO2 99%  Gen:   Awake, no distress   Resp:  Normal effort  MSK:   Moves extremities without difficulty  Other:  Right shoulder without pain to palpation.  There is tenderness to palpation of the right shoulder blade and musculature around the shoulder blade.  He does have sensation change to the right hand and ulnar distribution.  Medical Decision Making  Medically screening exam initiated at 11:46 AM.  Appropriate orders placed.  Terry Davidson was informed that the remainder of the evaluation will be completed by another provider, this initial triage assessment does not replace that evaluation, and the importance of remaining in the ED until their evaluation is complete.     Cristopher Peru, PA-C 09/05/21 1148    Pollyann Savoy, MD 09/05/21 [phone]

## 2021-09-05 NOTE — ED Provider Notes (Signed)
[facility] EMERGENCY DEPARTMENT Provider Note   CSN: [id number] Arrival date & time: 09/05/21  1022     History Chief Complaint  Patient presents with   Arm Pain   arm numbness    [place] Gipe is a 65 y.o. male.  HPI     65yo male with history of DM, hypertension, CVA who presents with concern for right sided numbness and weakness.  Reports it has been progressive since 3 months ago. No trauma. Denies other numbness, weakness, difficulty talking or walking, visual changes or facial droop.  No fever, chills.  No chest pain or dyspnea. Ibuprofen not helping. Pain radiating from right neck down to right ring and pinky finger. Shooting pain from neck to hand. Keeps him up at night.    Past Medical History:  Diagnosis Date   Diabetes mellitus without complication (HCC)    Hypertension    Stroke [facility])     Patient Active Problem List   Diagnosis Date Noted   Diabetes (HCC) 09/03/2016    Past Surgical History:  Procedure Laterality Date   APPENDECTOMY         No family history on file.  Social History   Tobacco Use   Smoking status: Never   Smokeless tobacco: Never  Substance Use Topics   Alcohol use: No   Drug use: Yes    Types: Marijuana    Home Medications Prior to Admission medications   Medication Sig Start Date End Date Taking? Authorizing Provider  gabapentin (NEURONTIN) 100 MG capsule Take 1 capsule (100 mg total) by mouth 3 (three) times daily. 09/05/21 10/05/21 Yes Alvira Monday, MD  methylPREDNISolone (MEDROL DOSEPAK) 4 MG TBPK tablet See instructions 09/05/21  Yes Alvira Monday, MD  aspirin 81 MG chewable tablet Chew 1 tablet (81 mg total) by mouth daily. Patient not taking: Reported on 06/14/2021 11/17/19   Ward, Layla Maw, DO  hydrochlorothiazide (HYDRODIURIL) 25 MG tablet Take 1 tablet (25 mg total) by mouth daily. Patient not taking: Reported on 09/05/2021 05/11/21   Tegeler, Canary Brim, MD  lidocaine  (LIDODERM) 5 % Place 1 patch onto the skin daily. Remove & Discard patch within 12 hours or as directed by MD Patient not taking: Reported on 06/14/2021 06/08/21   Edwin Dada P, DO  meclizine (ANTIVERT) 25 MG tablet Take 1-2 tablets (25-50 mg total) by mouth 3 (three) times daily as needed for dizziness. Patient not taking: Reported on 06/14/2021 11/17/19   Ward, Layla Maw, DO  methocarbamol (ROBAXIN) 500 MG tablet Take 1 tablet (500 mg total) by mouth 2 (two) times daily. 09/05/21   Alvira Monday, MD    Allergies    Ciprofloxacin and Morphine and related  Review of Systems   Review of Systems  Constitutional:  Negative for fever.  HENT:  Negative for sore throat.   Eyes:  Negative for visual disturbance.  Respiratory:  Negative for shortness of breath.   Cardiovascular:  Negative for chest pain.  Gastrointestinal:  Negative for abdominal pain, nausea and vomiting.  Genitourinary:  Negative for difficulty urinating.  Musculoskeletal:  Positive for arthralgias and neck pain. Negative for back pain and neck stiffness.  Skin:  Negative for rash.  Neurological:  Positive for weakness and numbness. Negative for syncope and headaches.   Physical Exam Updated Vital Signs BP (!) 165/81 (BP Location: Right Arm)   Pulse 73   Temp 98.5 F (36.9 C) (Oral)   Resp 17   SpO2 100%   Physical Exam  Vitals and nursing note reviewed.  Constitutional:      General: He is not in acute distress.    Appearance: He is well-developed. He is not diaphoretic.  HENT:     Head: Normocephalic and atraumatic.  Eyes:     Conjunctiva/sclera: Conjunctivae normal.  Cardiovascular:     Rate and Rhythm: Normal rate and regular rhythm.     Heart sounds: Normal heart sounds. No murmur heard.   No friction rub. No gallop.  Pulmonary:     Effort: Pulmonary effort is normal. No respiratory distress.     Breath sounds: Normal breath sounds. No wheezing or rales.  Abdominal:     General: There is no distension.      Palpations: Abdomen is soft.     Tenderness: There is no abdominal tenderness. There is no guarding.  Musculoskeletal:        General: Tenderness present.     Cervical back: Normal range of motion.  Skin:    General: Skin is warm and dry.  Neurological:     Mental Status: He is alert and oriented to person, place, and time.     GCS: GCS eye subscore is 4. GCS verbal subscore is 5. GCS motor subscore is 6.     Sensory: Sensory deficit (ulnar side of hand) present.     Motor: Weakness (finger abduction, arm extension) present.    ED Results / Procedures / Treatments   Labs (all labs ordered are listed, but only abnormal results are displayed) Labs Reviewed  COMPREHENSIVE METABOLIC PANEL - Abnormal; Notable for the following components:      Result Value   Glucose, Bld 139 (*)    All other components within normal limits  CBC WITH DIFFERENTIAL/PLATELET    EKG EKG Interpretation  Date/Time:  Sunday September 05 2021 15:48:26 EST Ventricular Rate:  56 PR Interval:  128 QRS Duration: 82 QT Interval:  420 QTC Calculation: 405 R Axis:   23 Text Interpretation: Sinus bradycardia Septal infarct , age undetermined Abnormal ECG No significant change since last tracing Confirmed by Gareth Morgan [phone]) on 09/05/2021 4:10:57 PM  Radiology MR BRAIN WO CONTRAST  Result Date: 09/05/2021 CLINICAL DATA:  Neuro deficit, acute, stroke suspected; per EMR history is right arm pain and numbness EXAM: MRI HEAD WITHOUT CONTRAST TECHNIQUE: Multiplanar, multiecho pulse sequences of the brain and surrounding structures were obtained without intravenous contrast. COMPARISON:  None. FINDINGS: Brain: There is no acute infarction or intracranial hemorrhage. There is no intracranial mass, mass effect, or edema. There is no hydrocephalus or extra-axial fluid collection. There are chronic small vessel infarcts of bilateral basal ganglia, thalamus, and adjacent white matter. Mild ex vacuo dilatation of  the lateral and third ventricles. Additional patchy T2 hyperintensity in the supratentorial and pontine white matter is nonspecific but may reflect mild chronic microvascular ischemic changes. Vascular: Major vessel flow voids at the skull base are preserved. Skull and upper cervical spine: Normal marrow signal is preserved. Sinuses/Orbits: Paranasal sinuses are aerated. Orbits are unremarkable. Other: Sella is unremarkable.  Mastoid air cells are clear. IMPRESSION: No acute infarction, hemorrhage, or mass. 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 Electronically Signed   By: Macy Mis M.D.   On: 09/05/2021 15:50   MR Cervical Spine Wo Contrast  Result Date: 09/05/2021 CLINICAL DATA:  Neck trauma, midline tenderness (Age 27-64y); per EMR history is right arm pain and numbness EXAM: MRI CERVICAL SPINE WITHOUT CONTRAST TECHNIQUE: Multiplanar, multisequence MR imaging of the cervical  spine was performed. No intravenous contrast was administered. COMPARISON:  None. FINDINGS: Alignment: No significant listhesis. Vertebrae: Mild degenerative endplate irregularity. Vertebral body heights are otherwise maintained. There is minor degenerative endplate marrow edema. No suspicious osseous lesion. Cord: No abnormal signal. Posterior Fossa, vertebral arteries, paraspinal tissues: Unremarkable. Disc levels: C2-C3: Facet hypertrophy. No significant canal or foraminal stenosis. C3-C4: Disc bulge with superimposed central extrusion extending just above and below disc level and endplate osteophytes. Uncovertebral greater than facet hypertrophy. Moderate canal stenosis. Moderate foraminal stenosis. C4-C5: Disc bulge with superimposed central disc protrusion and endplate osteophytes. Uncovertebral greater than facet hypertrophy. Mild canal stenosis. Moderate right and mild left foraminal stenosis. C5-C6: Disc bulge with endplate osteophytes. Uncovertebral and facet hypertrophy. Thickening of  the ligamentum flavum. Moderate canal stenosis. Moderate right and marked left foraminal stenosis. C6-C7: Disc bulge with endplate osteophytes. Uncovertebral greater than facet hypertrophy. Mild canal stenosis. Marked foraminal stenosis. C7-T1: Right foraminal protrusion with endplate osteophytes. No canal or left foraminal stenosis. Marked right foraminal stenosis. IMPRESSION: Multilevel degenerative changes as detailed above. Canal stenosis is greatest at C3-C4 and C5-C6. There is multilevel foraminal stenosis, greatest on the right at C6-C7 and C7-T1. Electronically Signed   By: Macy Mis M.D.   On: 09/05/2021 15:47    Procedures Procedures   Medications Ordered in ED Medications - No data to display  ED Course  I have reviewed the triage vital signs and the nursing notes.  Pertinent labs & imaging results that were available during my care of the patient were reviewed by me and considered in my medical decision making (see chart for details).    MDM Rules/Calculators/A&P                            65yo male with history of DM, hypertension, CVA who presents with concern for right sided numbness and weakness.  MRI brain and CSpine shows foraminal stenosis of cervical spine, degenerative disease.  Do not see indication for emergent surgery given duration of symptoms however recommend Neurosurgery follow up for pain and numbness/weakness. Given steroids, gabapentin, methocarbamol. Patient discharged in stable condition with understanding of reasons to return.     Final Clinical Impression(s) / ED Diagnoses Final diagnoses:  Cervical radiculopathy  Degeneration, intervertebral disc, cervical  Foraminal stenosis of cervical region    Rx / DC Orders ED Discharge Orders          Ordered    methylPREDNISolone (MEDROL DOSEPAK) 4 MG TBPK tablet        09/05/21 1615    gabapentin (NEURONTIN) 100 MG capsule  3 times daily        09/05/21 1615    methocarbamol (ROBAXIN) 500 MG  tablet  2 times daily        09/05/21 1615             Gareth Morgan, MD 09/05/21 2316

## 2021-09-05 NOTE — ED Triage Notes (Signed)
C/o pain and numbness to R pinky that radiates up R arm x 3 month.  Denies injury.

## 2022-06-16 IMAGING — MR MR HEAD W/O CM
12 of 13 series · 44 of 48 positions shown · non-contrast
Comparison: None.

CLINICAL DATA: Neuro deficit, acute, stroke suspected; per EMR
history is right arm pain and numbness

EXAM:
MRI HEAD WITHOUT CONTRAST
TECHNIQUE: Multiplanar, multiecho pulse sequences of the brain and surrounding
structures were obtained without intravenous contrast.

[Series 5: DWI · axial · 3.0mm · 0.88mm/px · z∈[-115,+26]mm · 9 of 96 slices shown (1 of 4)]
[im 1/96]
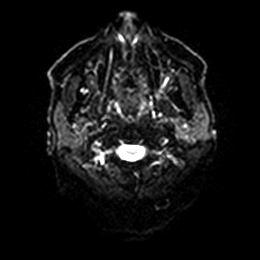
[im 12/96]
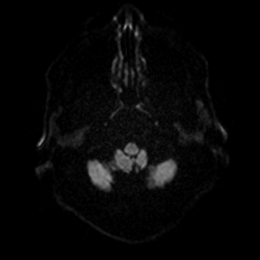
[im 24/96]
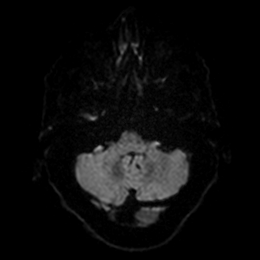
[im 36/96]
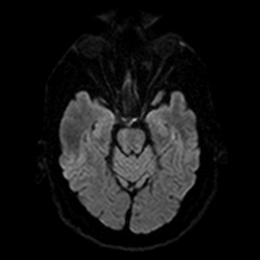
[im 48/96]
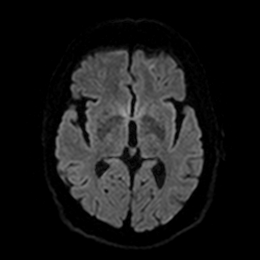
[im 60/96]
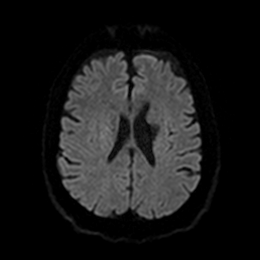
[im 72/96]
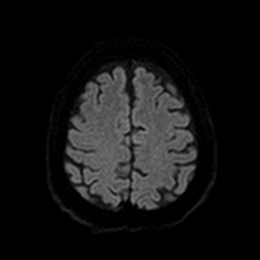
[im 84/96]
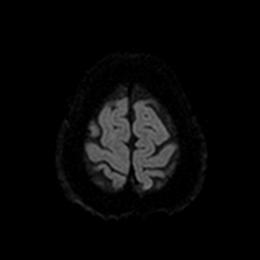
[im 96/96]
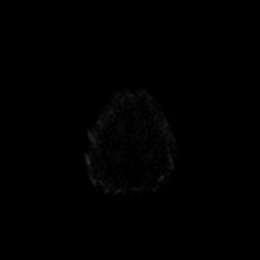

[Series 6: DWI · axial · 3.0mm · 0.88mm/px · z∈[-115,+26]mm · 4 of 48 slices shown (2 of 4)]
[im 1/48]
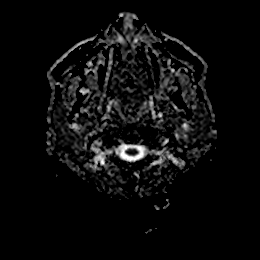
[im 16/48]
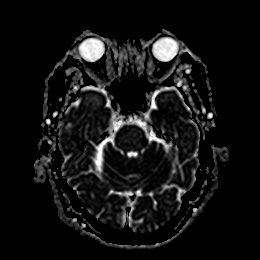
[im 32/48]
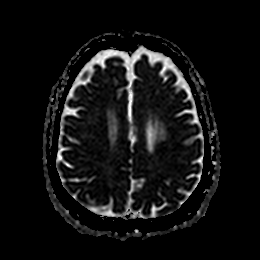
[im 48/48]
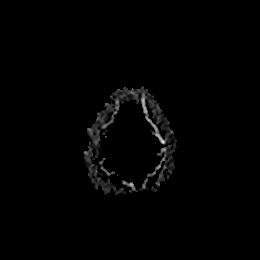

[Series 7: DWI · coronal · 4.0mm · 0.88mm/px · 5 of 64 slices shown (3 of 4)]
[im 1/64]
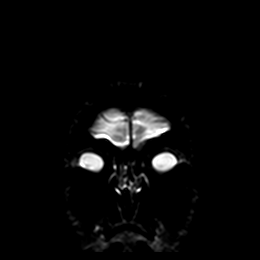
[im 16/64]
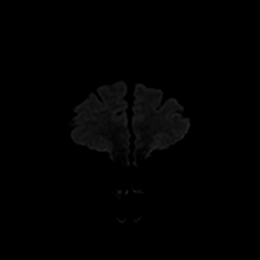
[im 32/64]
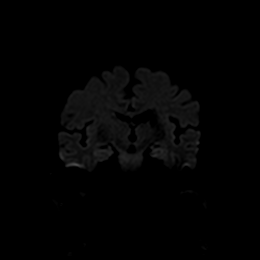
[im 48/64]
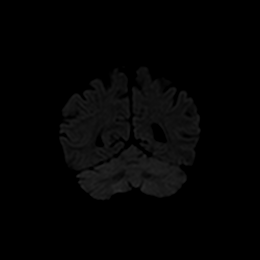
[im 64/64]
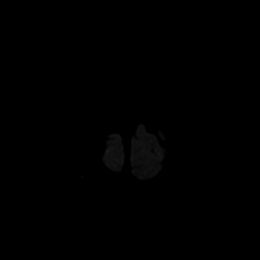

[Series 8: DWI · coronal · 4.0mm · 0.88mm/px · 3 of 32 slices shown (4 of 4)]
[im 1/32]
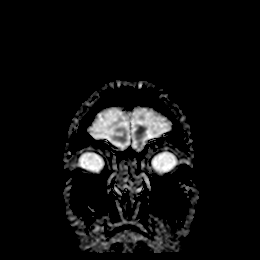
[im 16/32]
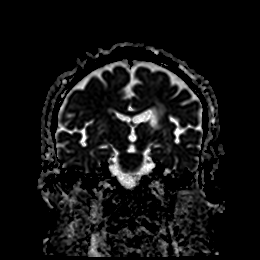
[im 32/32]
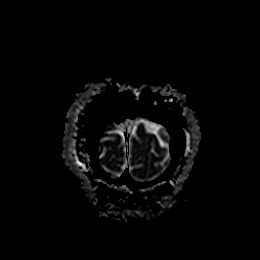

[Series 9: T1 · sagittal · 5.0mm · 0.75mm/px · 2 of 23 slices shown]
[im 1/23]
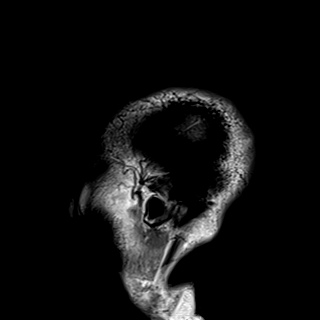
[im 23/23]
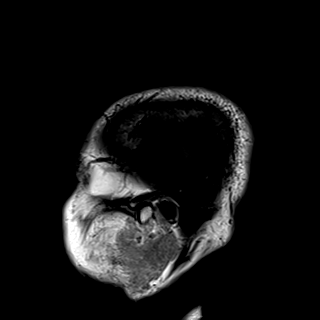

[Series 10: T2 · axial · 5.0mm · 0.72mm/px · z∈[-113,+24]mm · 2 of 24 slices shown (1 of 2)]
[im 1/24]
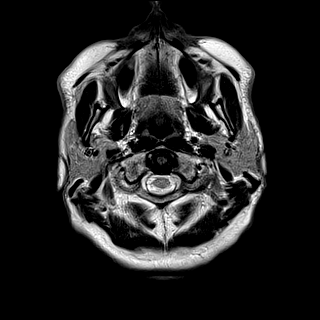
[im 24/24]
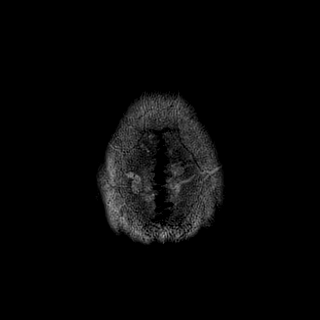

[Series 11: FLAIR · axial · 5.0mm · 0.45mm/px · z∈[-114,+24]mm · 2 of 24 slices shown]
[im 1/24]
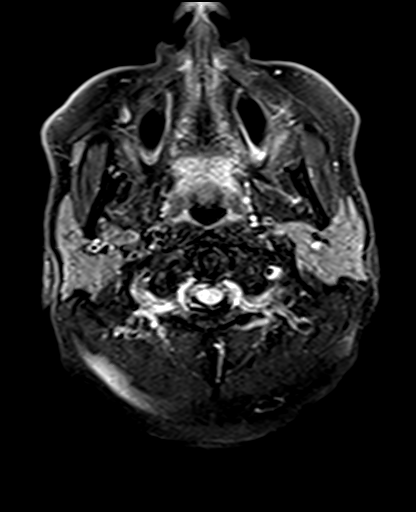
[im 24/24]
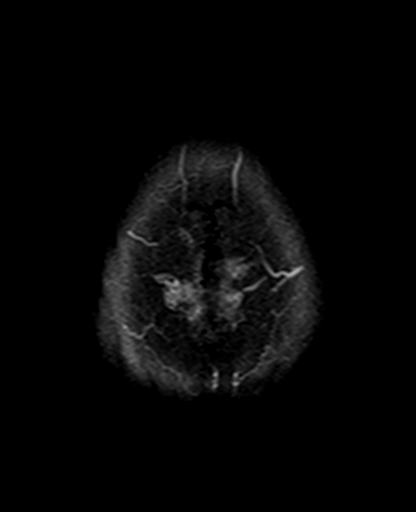

[Series 12: mag_images · axial · 3.0mm · 0.90mm/px · z∈[-117,+24]mm · 4 of 48 slices shown]
[im 1/48]
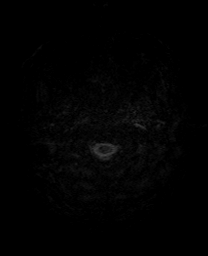
[im 16/48]
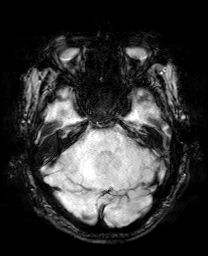
[im 32/48]
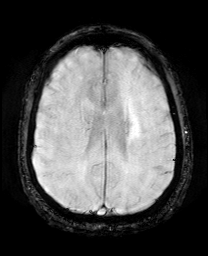
[im 48/48]
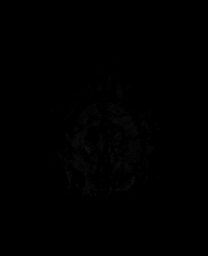

[Series 13: pha_images · axial · 3.0mm · 0.90mm/px · z∈[-117,+24]mm · 4 of 48 slices shown]
[im 1/48]
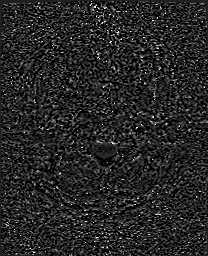
[im 16/48]
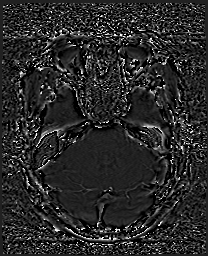
[im 32/48]
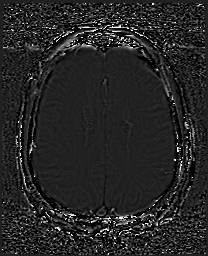
[im 48/48]
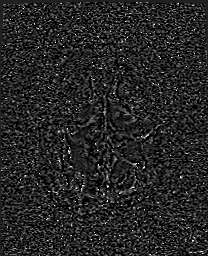

[Series 14: swi_images · axial · 3.0mm · 0.90mm/px · z∈[-117,+24]mm · 4 of 48 slices shown]
[im 1/48]
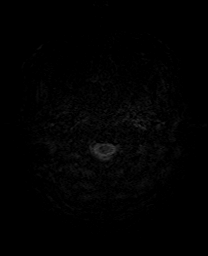
[im 16/48]
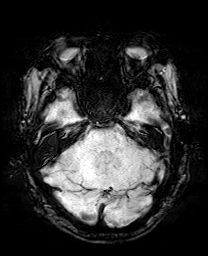
[im 32/48]
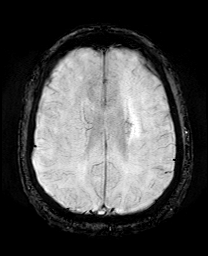
[im 48/48]
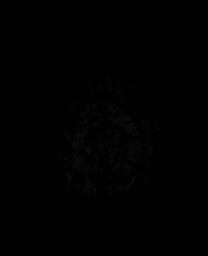

[Series 15: mip_images(sw) · axial · 24.0mm · 0.90mm/px · z∈[-107,+13]mm · 3 of 41 slices shown]
[im 1/41]
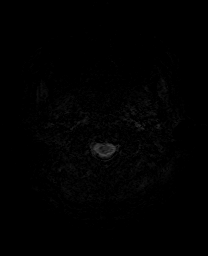
[im 21/41]
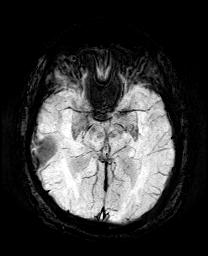
[im 41/41]
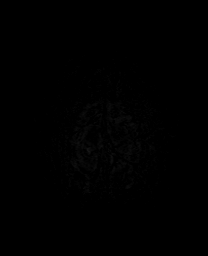

[Series 17: T2 · coronal · 5.0mm · 0.34mm/px · 2 of 28 slices shown (2 of 2)]
[im 1/28]
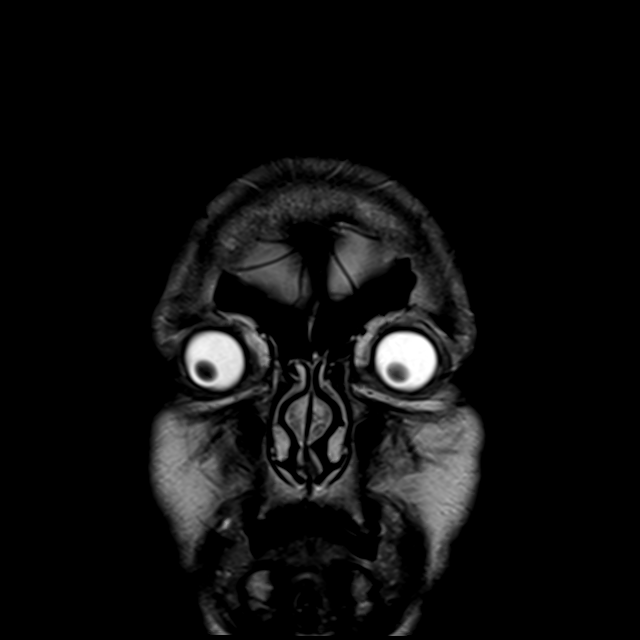
[im 28/28]
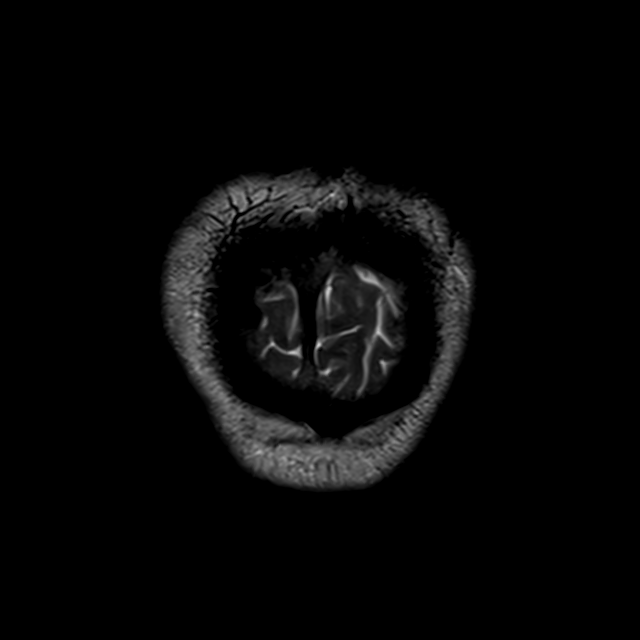

[44 of 48 positions shown; findings below may reference images not displayed]

FINDINGS: Brain: There is no acute infarction or intracranial hemorrhage.
There is no intracranial mass, mass effect, or edema. There is no
hydrocephalus or extra-axial fluid collection. There are chronic
small vessel infarcts of bilateral basal ganglia, thalamus, and
adjacent white matter. Mild ex vacuo dilatation of the lateral and
third ventricles. Additional patchy T2 hyperintensity in the
supratentorial and pontine white matter is nonspecific but may
reflect mild chronic microvascular ischemic changes.

Vascular: Major vessel flow voids at the skull base are preserved.

Skull and upper cervical spine: Normal marrow signal is preserved.

Sinuses/Orbits: Paranasal sinuses are aerated. Orbits are
unremarkable.

Other: Sella is unremarkable.  Mastoid air cells are clear.
IMPRESSION: No acute infarction, hemorrhage, or mass.

Chronic microvascular ischemic changes with chronic small vessel
infarcts.

## 2022-06-20 ENCOUNTER — Emergency Department (HOSPITAL_COMMUNITY): Payer: Self-pay

## 2022-06-20 ENCOUNTER — Emergency Department (HOSPITAL_COMMUNITY)
Admission: EM | Admit: 2022-06-20 | Discharge: 2022-06-20 | Disposition: A | Discharge status: Home / Self Care | Payer: Self-pay | Attending: Emergency Medicine | Admitting: Emergency Medicine

## 2022-06-20 ENCOUNTER — Other Ambulatory Visit: Payer: Self-pay

## 2022-06-20 ENCOUNTER — Encounter (HOSPITAL_COMMUNITY): Payer: Self-pay

## 2022-06-20 DIAGNOSIS — R051 Acute cough: Secondary | ICD-10-CM | POA: Insufficient documentation

## 2022-06-20 DIAGNOSIS — I10 Essential (primary) hypertension: Secondary | ICD-10-CM | POA: Insufficient documentation

## 2022-06-20 DIAGNOSIS — R062 Wheezing: Secondary | ICD-10-CM | POA: Insufficient documentation

## 2022-06-20 DIAGNOSIS — R519 Headache, unspecified: Secondary | ICD-10-CM | POA: Insufficient documentation

## 2022-06-20 DIAGNOSIS — R0602 Shortness of breath: Secondary | ICD-10-CM | POA: Insufficient documentation

## 2022-06-20 DIAGNOSIS — M791 Myalgia, unspecified site: Secondary | ICD-10-CM | POA: Insufficient documentation

## 2022-06-20 DIAGNOSIS — Z20822 Contact with and (suspected) exposure to covid-19: Secondary | ICD-10-CM | POA: Insufficient documentation

## 2022-06-20 DIAGNOSIS — E1165 Type 2 diabetes mellitus with hyperglycemia: Secondary | ICD-10-CM | POA: Insufficient documentation

## 2022-06-20 LAB — COMPREHENSIVE METABOLIC PANEL
ALT: 21 U/L (ref 0–44)
AST: 31 U/L (ref 15–41)
Albumin: 4 g/dL (ref 3.5–5.0)
Alkaline Phosphatase: 67 U/L (ref 38–126)
Anion gap: 10 (ref 5–15)
BUN: 18 mg/dL (ref 8–23)
CO2: 20 mmol/L — ABNORMAL LOW (ref 22–32)
Calcium: 9.2 mg/dL (ref 8.9–10.3)
Chloride: 105 mmol/L (ref 98–111)
Creatinine, Ser: 0.82 mg/dL (ref 0.61–1.24)
GFR, Estimated: >60 mL/min (ref >60)
Glucose, Bld: 243 mg/dL — ABNORMAL HIGH (ref 70–99)
Potassium: 4.8 mmol/L (ref 3.5–5.1)
Sodium: 135 mmol/L (ref 135–145)
Total Bilirubin: 1 mg/dL (ref 0.3–1.2)
Total Protein: 7 g/dL (ref 6.5–8.1)

## 2022-06-20 LAB — CBC WITH DIFFERENTIAL/PLATELET
Abs Immature Granulocytes: 0.01 10*3/uL (ref 0.00–0.07)
Basophils Absolute: 0.1 10*3/uL (ref 0.0–0.1)
Basophils Relative: 1 %
Eosinophils Absolute: 0.1 10*3/uL (ref 0.0–0.5)
Eosinophils Relative: 2 %
HCT: 42.3 % (ref 39.0–52.0)
Hemoglobin: 13.8 g/dL (ref 13.0–17.0)
Immature Granulocytes: 0 %
Lymphocytes Relative: 28 %
Lymphs Abs: 1.9 10*3/uL (ref 0.7–4.0)
MCH: 29.2 pg (ref 26.0–34.0)
MCHC: 32.6 g/dL (ref 30.0–36.0)
MCV: 89.6 fL (ref 80.0–100.0)
Monocytes Absolute: 0.4 10*3/uL (ref 0.1–1.0)
Monocytes Relative: 6 %
Neutro Abs: 4.3 10*3/uL (ref 1.7–7.7)
Neutrophils Relative %: 63 %
Platelets: 256 10*3/uL (ref 150–400)
RBC: 4.72 MIL/uL (ref 4.22–5.81)
RDW: 12.7 % (ref 11.5–15.5)
WBC: 6.9 10*3/uL (ref 4.0–10.5)
nRBC: 0 % (ref 0.0–0.2)

## 2022-06-20 LAB — RESP PANEL BY RT-PCR (FLU A&B, COVID) ARPGX2
Influenza A by PCR: NEGATIVE
Influenza B by PCR: NEGATIVE
SARS Coronavirus 2 by RT PCR: NEGATIVE

## 2022-06-20 LAB — CBG MONITORING, ED: Glucose-Capillary: 254 mg/dL — ABNORMAL HIGH (ref 70–99)

## 2022-06-20 LAB — TROPONIN I (HIGH SENSITIVITY)
Troponin I (High Sensitivity): 5 ng/L (ref <18)
Troponin I (High Sensitivity): 6 ng/L (ref <18)

## 2022-06-20 LAB — LIPASE, BLOOD: Lipase: 27 U/L (ref 11–51)

## 2022-06-20 MED ORDER — ALBUTEROL SULFATE HFA 108 (90 BASE) MCG/ACT IN AERS
1.0000 | INHALATION_SPRAY | RESPIRATORY_TRACT | Status: DC | PRN
  Administered 2022-06-20: 1 via RESPIRATORY_TRACT
  Filled 2022-06-20: qty 6.7

## 2022-06-20 MED ORDER — IPRATROPIUM-ALBUTEROL 0.5-2.5 (3) MG/3ML IN SOLN
3.0000 mL | Freq: Once | RESPIRATORY_TRACT | Status: AC
  Administered 2022-06-20: 3 mL via RESPIRATORY_TRACT
  Filled 2022-06-20: qty 3

## 2022-06-20 MED ORDER — ACETAMINOPHEN 500 MG PO TABS
1000.0000 mg | ORAL_TABLET | Freq: Once | ORAL | Status: AC
  Administered 2022-06-20: 1000 mg via ORAL
  Filled 2022-06-20: qty 2

## 2022-06-20 NOTE — Discharge Instructions (Addendum)
You have been seen today for your complaint of cough, body aches, headache. Your lab work was reassuring and showed no abnormalities. Your imaging was reassuring and showed no abnormalities. Your discharge medications include benzonatate.  This is a cough medicine.  You should take it as prescribed and as needed for cough. Home care instructions are as follows:  You should continue to eat and drink as normal.  You should monitor your temperature for fevers.  You may drink warm liquids with honey for sore throat. Follow up with: Your primary care provider in 1 week Please seek immediate medical care if you develop any of the following symptoms: You cough up blood. You have trouble breathing. Your heartbeat is very fast. At this time there does not appear to be the presence of an emergent medical condition, however there is always the potential for conditions to change. Please read and follow the below instructions.  Do not take your medicine if  develop an itchy rash, swelling in your mouth or lips, or difficulty breathing; call 911 and seek immediate emergency medical attention if this occurs.  You may review your lab tests and imaging results in their entirety on your MyChart account.  Please discuss all results of fully with your primary care provider and other specialist at your follow-up visit.  Note: Portions of this text may have been transcribed using voice recognition software. Every effort was made to ensure accuracy; however, inadvertent computerized transcription errors may still be present.

## 2022-06-20 NOTE — ED Provider Notes (Signed)
[facility] EMERGENCY DEPARTMENT Provider Note   CSN: [id number] Arrival date & time: 06/20/22  1044     History  Chief Complaint  Patient presents with   Generalized Body Aches    Terry Davidson is a 66 y.o. male.  Very pleasant and with a history of hypertension and diabetes who presents ED for evaluation of 3 days of cough, body aches and headache.  Patient is homeless.  He states his girlfriend was diagnosed with COVID approximately 1 week ago and has been in close contact with her.  Also reports shortness of breath, but only after he coughs.  States he coughs most often when he is lying flat, is relieved by sitting up.  Cough is productive of white sputum.  Denies fevers, chills, rhinorrhea, nasal congestion, sore throat, chest pain, hemoptysis, numbness, weakness, tingling.  Does not have a primary care provider.  No history of smoking.  HPI     Home Medications Prior to Admission medications   Medication Sig Start Date End Date Taking? Authorizing Provider  aspirin 81 MG chewable tablet Chew 1 tablet (81 mg total) by mouth daily. Patient not taking: Reported on 06/14/2021 11/17/19   Ward, Layla Maw, DO  gabapentin (NEURONTIN) 100 MG capsule Take 1 capsule (100 mg total) by mouth 3 (three) times daily. 09/05/21 10/05/21  Alvira Monday, MD  hydrochlorothiazide (HYDRODIURIL) 25 MG tablet Take 1 tablet (25 mg total) by mouth daily. Patient not taking: Reported on 09/05/2021 05/11/21   Tegeler, Canary Brim, MD  lidocaine (LIDODERM) 5 % Place 1 patch onto the skin daily. Remove & Discard patch within 12 hours or as directed by MD Patient not taking: Reported on 06/14/2021 06/08/21   Edwin Dada P, DO  meclizine (ANTIVERT) 25 MG tablet Take 1-2 tablets (25-50 mg total) by mouth 3 (three) times daily as needed for dizziness. Patient not taking: Reported on 06/14/2021 11/17/19   Ward, Layla Maw, DO  methocarbamol (ROBAXIN) 500 MG tablet Take 1 tablet (500 mg  total) by mouth 2 (two) times daily. 09/05/21   Alvira Monday, MD  methylPREDNISolone (MEDROL DOSEPAK) 4 MG TBPK tablet See instructions 09/05/21   Alvira Monday, MD      Allergies    Ciprofloxacin and Morphine and related    Review of Systems   Review of Systems  Respiratory:  Positive for cough.   Neurological:  Positive for headaches.  All other systems reviewed and are negative.   Physical Exam Updated Vital Signs BP (!) 187/87 (BP Location: Left Arm)   Pulse (!) 57   Temp 98.7 F (37.1 C)   Resp 16   Ht 5\' 7"  (1.702 m)   Wt 88.5 kg   SpO2 95%   BMI 30.54 kg/m  Physical Exam Vitals and nursing note reviewed.  Constitutional:      General: He is not in acute distress.    Appearance: He is well-developed. He is not ill-appearing or toxic-appearing.  HENT:     Head: Normocephalic and atraumatic.     Nose: No congestion or rhinorrhea.     Mouth/Throat:     Mouth: Mucous membranes are moist.     Pharynx: Oropharynx is clear. No oropharyngeal exudate or posterior oropharyngeal erythema.  Eyes:     Conjunctiva/sclera: Conjunctivae normal.     Pupils: Pupils are equal, round, and reactive to light.  Cardiovascular:     Rate and Rhythm: Normal rate and regular rhythm.     Pulses: Normal pulses.  Heart sounds: Normal heart sounds. No murmur heard. Pulmonary:     Effort: Pulmonary effort is normal. No respiratory distress.     Breath sounds: No stridor. Wheezing present. No rhonchi or rales.  Abdominal:     General: Abdomen is flat.     Palpations: Abdomen is soft.     Tenderness: There is no abdominal tenderness.  Musculoskeletal:        General: No swelling or tenderness.     Cervical back: Neck supple.     Right lower leg: No edema.     Left lower leg: No edema.  Skin:    General: Skin is warm and dry.     Capillary Refill: Capillary refill takes less than 2 seconds.  Neurological:     General: No focal deficit present.     Mental Status: He is alert  and oriented to person, place, and time.  Psychiatric:        Mood and Affect: Mood normal.     ED Results / Procedures / Treatments   Labs (all labs ordered are listed, but only abnormal results are displayed) Labs Reviewed  COMPREHENSIVE METABOLIC PANEL - Abnormal; Notable for the following components:      Result Value   CO2 20 (*)    Glucose, Bld 243 (*)    All other components within normal limits  CBG MONITORING, ED - Abnormal; Notable for the following components:   Glucose-Capillary 254 (*)    All other components within normal limits  RESP PANEL BY RT-PCR (FLU A&B, COVID) ARPGX2  CBC WITH DIFFERENTIAL/PLATELET  LIPASE, BLOOD  TROPONIN I (HIGH SENSITIVITY)  TROPONIN I (HIGH SENSITIVITY)    EKG None  Radiology DG Chest 2 View  Result Date: 06/20/2022 CLINICAL DATA:  Shortness of breath, body aches EXAM: CHEST - 2 VIEW COMPARISON:  06/08/2021 FINDINGS: Cardiac size is within normal limits. There are no signs of pulmonary edema or focal pulmonary consolidation. There is no pleural effusion or pneumothorax. IMPRESSION: No active cardiopulmonary disease. Electronically Signed   By: Ernie Avena M.D.   On: 06/20/2022 13:13    Procedures Procedures    Medications Ordered in ED Medications  albuterol (VENTOLIN HFA) 108 (90 Base) MCG/ACT inhaler 1 puff (1 puff Inhalation Provided for home use 06/20/22 2015)  acetaminophen (TYLENOL) tablet 1,000 mg (1,000 mg Oral Given 06/20/22 1844)  ipratropium-albuterol (DUONEB) 0.5-2.5 (3) MG/3ML nebulizer solution 3 mL (3 mLs Nebulization Given 06/20/22 1943)    ED Course/ Medical Decision Making/ A&P                           Medical Decision Making Amount and/or Complexity of Data Reviewed Labs: ordered. Radiology: ordered.  Risk OTC drugs. Prescription drug management.  This patient presents to the ED for concern of cough, body aches, headache, this involves an extensive number of treatment options, and is a complaint  that carries with it a high risk of complications and morbidity.  The differential diagnosis includes upper respiratory infection, flu, COVID, pneumonia, bronchitis, ACS, pneumothorax   Co morbidities that complicate the patient evaluation  Homelessness, diabetes  My initial workup includes Tylenol for headache, respiratory panel, lipase, CBC, CMP, troponin, delta troponin  Additional history obtained from: Nursing notes from this visit.  I ordered, reviewed and interpreted labs which include: respiratory panel, lipase, CBC, CMP, troponin, delta troponin.  Blood glucose elevated at 243.  All other labs within normal limits.   I ordered  imaging studies including chest x-ray I independently visualized and interpreted imaging which showed no acute cardiopulmonary abnormalities. I agree with the radiologist interpretation  Afebrile, hemodynamically stable.  Patient is a 66 year old homeless male with a history of diabetes and hypertension who presents ED for evaluation of symptoms as described above.  Physical exam shows wheezing, is otherwise unremarkable.  Patient has no shortness of breath at rest.  Only has shortness of breath when he has coughing spells.  Lab work and chest x-ray unremarkable.  Patient was given Tylenol for his headache which she states improved his pain significantly.  Was also given a DuoNeb treatment for wheezing.  He states he felt better after this.  Was given an albuterol inhaler to go home with.  Patient was requesting to leave because he needs to get to the bus before they start running.  Patient was given resource list for financial assistance and shelters.  Stable at discharge.  At this time there does not appear to be any evidence of an acute emergency medical condition and the patient appears stable for discharge with appropriate outpatient follow up. Diagnosis was discussed with patient who verbalizes understanding of care plan and is agreeable to discharge. I have  discussed return precautions with patient who verbalizes understanding. Patient encouraged to follow-up with their PCP within 1 week. All questions answered.  Patient's case discussed with Dr. Mayra Neer who agrees with plan to discharge with follow-up.   Note: Portions of this report may have been transcribed using voice recognition software. Every effort was made to ensure accuracy; however, inadvertent computerized transcription errors may still be present.          Final Clinical Impression(s) / ED Diagnoses Final diagnoses:  Acute cough    Rx / DC Orders ED Discharge Orders     None         Roylene Reason, Hershal Coria 06/20/22 2038    Audley Hose, MD 06/21/22 1044

## 2022-06-20 NOTE — ED Provider Triage Note (Signed)
Emergency Medicine Provider Triage Evaluation Note  Terry Davidson , a 66 y.o. male  was evaluated in triage.  Pt complains of generalized myalgias, chest pain, shortness of breath, abdominal pain, nausea, diarrhea.  Patient states symptoms began proximately 3 days ago.  He is homeless but reports no history of cardiac or pulmonary issues.  Denies association with physical activity.  Denies hematochezia or melena, fever, chills, night sweats.  Review of Systems  Positive: See above Negative:  Physical Exam  BP (!) 170/92 (BP Location: Right Arm)   Pulse (!) 56   Temp 98.5 F (36.9 C) (Oral)   Resp 15   Ht 5\' 7"  (1.702 m)   Wt 88.5 kg   SpO2 95%   BMI 30.54 kg/m  Gen:   Awake, no distress   Resp:  Normal effort  MSK:   Moves extremities without difficulty  Other:  Mild rhonchi auscultated on bilateral lower lung fields.  No obvious murmurs gallops rubs.  Epigastric abdominal pain.  Medical Decision Making  Medically screening exam initiated at 11:31 AM.  Appropriate orders placed.  Terry Davidson was informed that the remainder of the evaluation will be completed by another provider, this initial triage assessment does not replace that evaluation, and the importance of remaining in the ED until their evaluation is complete.     Wilnette Kales, [place] 06/20/22 1134

## 2022-06-20 NOTE — ED Triage Notes (Signed)
Patient reports he is homeless and hurts all over and having sob.  Denies cardiac hx or lung problems.

## 2022-06-21 ENCOUNTER — Telehealth: Payer: Self-pay | Admitting: *Deleted

## 2022-06-21 NOTE — Telephone Encounter (Signed)
Pt called regarding which pharmacy Rx was e-scribed to.  RNCM reviewed chart to access After Visit Summary and found that Rx was not written.  Advised pt to return to pcp/urgent care/ED if he was in need of medication.

## 2023-08-18 ENCOUNTER — Other Ambulatory Visit (HOSPITAL_COMMUNITY): Payer: Self-pay

## 2023-08-18 ENCOUNTER — Other Ambulatory Visit: Payer: Self-pay

## 2023-08-18 ENCOUNTER — Encounter (HOSPITAL_COMMUNITY): Payer: Self-pay

## 2023-08-18 ENCOUNTER — Emergency Department (HOSPITAL_COMMUNITY)
Admission: EM | Admit: 2023-08-18 | Discharge: 2023-08-18 | Disposition: A | Discharge status: Home / Self Care | Payer: Self-pay | Attending: Emergency Medicine | Admitting: Emergency Medicine

## 2023-08-18 DIAGNOSIS — Z7984 Long term (current) use of oral hypoglycemic drugs: Secondary | ICD-10-CM | POA: Insufficient documentation

## 2023-08-18 DIAGNOSIS — E119 Type 2 diabetes mellitus without complications: Secondary | ICD-10-CM | POA: Insufficient documentation

## 2023-08-18 DIAGNOSIS — Z8673 Personal history of transient ischemic attack (TIA), and cerebral infarction without residual deficits: Secondary | ICD-10-CM | POA: Insufficient documentation

## 2023-08-18 DIAGNOSIS — I10 Essential (primary) hypertension: Secondary | ICD-10-CM | POA: Insufficient documentation

## 2023-08-18 DIAGNOSIS — R3589 Other polyuria: Secondary | ICD-10-CM | POA: Insufficient documentation

## 2023-08-18 DIAGNOSIS — Z7982 Long term (current) use of aspirin: Secondary | ICD-10-CM | POA: Insufficient documentation

## 2023-08-18 DIAGNOSIS — N50819 Testicular pain, unspecified: Secondary | ICD-10-CM | POA: Insufficient documentation

## 2023-08-18 DIAGNOSIS — Z79899 Other long term (current) drug therapy: Secondary | ICD-10-CM | POA: Insufficient documentation

## 2023-08-18 LAB — CBC
HCT: 40.2 % (ref 39.0–52.0)
Hemoglobin: 13.2 g/dL (ref 13.0–17.0)
MCH: 28.6 pg (ref 26.0–34.0)
MCHC: 32.8 g/dL (ref 30.0–36.0)
MCV: 87.2 fL (ref 80.0–100.0)
Platelets: 246 10*3/uL (ref 150–400)
RBC: 4.61 MIL/uL (ref 4.22–5.81)
RDW: 12.9 % (ref 11.5–15.5)
WBC: 5.8 10*3/uL (ref 4.0–10.5)
nRBC: 0 % (ref 0.0–0.2)

## 2023-08-18 LAB — URINALYSIS, W/ REFLEX TO CULTURE (INFECTION SUSPECTED)
Bacteria, UA: NONE SEEN
Bilirubin Urine: NEGATIVE
Glucose, UA: NEGATIVE mg/dL
Hgb urine dipstick: NEGATIVE
Ketones, ur: 5 mg/dL — AB
Leukocytes,Ua: NEGATIVE
Nitrite: NEGATIVE
Protein, ur: NEGATIVE mg/dL
Specific Gravity, Urine: 1.018 (ref 1.005–1.030)
pH: 5 (ref 5.0–8.0)

## 2023-08-18 LAB — BASIC METABOLIC PANEL
Anion gap: 9 (ref 5–15)
BUN: 21 mg/dL (ref 8–23)
CO2: 23 mmol/L (ref 22–32)
Calcium: 9.1 mg/dL (ref 8.9–10.3)
Chloride: 101 mmol/L (ref 98–111)
Creatinine, Ser: 0.89 mg/dL (ref 0.61–1.24)
GFR, Estimated: >60 mL/min (ref >60)
Glucose, Bld: 192 mg/dL — ABNORMAL HIGH (ref 70–99)
Potassium: 3.9 mmol/L (ref 3.5–5.1)
Sodium: 133 mmol/L — ABNORMAL LOW (ref 135–145)

## 2023-08-18 MED ORDER — AMLODIPINE BESYLATE 5 MG PO TABS
5.0000 mg | ORAL_TABLET | Freq: Every day | ORAL | 0 refills | Status: AC
  Filled 2023-08-18: qty 30, 30d supply, fill #0

## 2023-08-18 MED ORDER — DOXYCYCLINE HYCLATE 100 MG PO TABS
100.0000 mg | ORAL_TABLET | Freq: Two times a day (BID) | ORAL | 0 refills | Status: AC
  Filled 2023-08-18: qty 20, 10d supply, fill #0

## 2023-08-18 NOTE — Discharge Instructions (Signed)

## 2023-08-18 NOTE — ED Provider Notes (Signed)
[facility]GENCY DEPARTMENT AT [facility] Provider Note   CSN: [id number] Arrival date & time: 08/18/23  2952     History  Chief Complaint  Patient presents with   Polyuria    Terry Davidson is a 67 y.o. male.  HPI   Patient has a history of prior stroke, diabetes, hypertension.  Patient presents to the ED for evaluation of polyuria.  Patient states he has had increased urination over the last few days.  He is urinating small amounts but then 20 minutes later feels like he has to go again.  He has had some mild discomfort in this testicle region but has not noticed any swelling.  He is not having any abdominal pain.  No fevers.  No pulmonary dips ER.  Home Medications Prior to Admission medications   Medication Sig Start Date End Date Taking? Authorizing Provider  amLODipine (NORVASC) 5 MG tablet Take 1 tablet (5 mg total) by mouth daily. 08/18/23  Yes Linwood Dibbles, MD  doxycycline (VIBRA-TABS) 100 MG tablet Take 1 tablet (100 mg total) by mouth 2 (two) times daily. 08/18/23  Yes Linwood Dibbles, MD  aspirin 81 MG chewable tablet Chew 1 tablet (81 mg total) by mouth daily. Patient not taking: Reported on 06/14/2021 11/17/19   Ward, Layla Maw, DO  gabapentin (NEURONTIN) 100 MG capsule Take 1 capsule (100 mg total) by mouth 3 (three) times daily. 09/05/21 10/05/21  Alvira Monday, MD  hydrochlorothiazide (HYDRODIURIL) 25 MG tablet Take 1 tablet (25 mg total) by mouth daily. Patient not taking: Reported on 09/05/2021 05/11/21   Tegeler, Canary Brim, MD  lidocaine (LIDODERM) 5 % Place 1 patch onto the skin daily. Remove & Discard patch within 12 hours or as directed by MD Patient not taking: Reported on 06/14/2021 06/08/21   Edwin Dada P, DO  meclizine (ANTIVERT) 25 MG tablet Take 1-2 tablets (25-50 mg total) by mouth 3 (three) times daily as needed for dizziness. Patient not taking: Reported on 06/14/2021 11/17/19   Ward, Layla Maw, DO  methocarbamol (ROBAXIN) 500 MG  tablet Take 1 tablet (500 mg total) by mouth 2 (two) times daily. 09/05/21   Alvira Monday, MD  methylPREDNISolone (MEDROL DOSEPAK) 4 MG TBPK tablet See instructions 09/05/21   Alvira Monday, MD      Allergies    Ciprofloxacin and Morphine and codeine    Review of Systems   Review of Systems  Physical Exam Updated Vital Signs BP (!) 170/104 (BP Location: Right Arm)   Pulse 89   Temp 98.1 F (36.7 C) (Oral)   Resp 16   Ht 1.676 m (5\' 6" )   Wt 77.1 kg   SpO2 98%   BMI 27.44 kg/m  Physical Exam Vitals and nursing note reviewed.  Constitutional:      General: He is not in acute distress.    Appearance: He is well-developed.  HENT:     Head: Normocephalic and atraumatic.     Right Ear: External ear normal.     Left Ear: External ear normal.  Eyes:     General: No scleral icterus.       Right eye: No discharge.        Left eye: No discharge.     Conjunctiva/sclera: Conjunctivae normal.  Neck:     Trachea: No tracheal deviation.  Cardiovascular:     Rate and Rhythm: Normal rate and regular rhythm.  Pulmonary:     Effort: Pulmonary effort is normal. No respiratory distress.  Breath sounds: Normal breath sounds. No stridor. No wheezing or rales.  Abdominal:     General: Bowel sounds are normal. There is no distension.     Palpations: Abdomen is soft.     Tenderness: There is no abdominal tenderness. There is no guarding or rebound.  Musculoskeletal:        General: No tenderness or deformity.     Cervical back: Neck supple.  Skin:    General: Skin is warm and dry.     Findings: No rash.  Neurological:     General: No focal deficit present.     Mental Status: He is alert.     Cranial Nerves: No cranial nerve deficit, dysarthria or facial asymmetry.     Sensory: No sensory deficit.     Motor: No abnormal muscle tone or seizure activity.     Coordination: Coordination normal.  Psychiatric:        Mood and Affect: Mood normal.     ED Results / Procedures /  Treatments   Labs (all labs ordered are listed, but only abnormal results are displayed) Labs Reviewed  BASIC METABOLIC PANEL - Abnormal; Notable for the following components:      Result Value   Sodium 133 (*)    Glucose, Bld 192 (*)    All other components within normal limits  URINALYSIS, W/ REFLEX TO CULTURE (INFECTION SUSPECTED) - Abnormal; Notable for the following components:   Ketones, ur 5 (*)    All other components within normal limits  CBC    EKG None  Radiology No results found.  Procedures Procedures    Medications Ordered in ED Medications - No data to display  ED Course/ Medical Decision Making/ A&P Clinical Course as of 08/18/23 1040  Fri Aug 18, 2023  1007 CBC normal.  Metabolic panel normal.  Urinalysis not suggestive of infection [JK]    Clinical Course User Index [JK] Linwood Dibbles, MD                                 Medical Decision Making Amount and/or Complexity of Data Reviewed Labs: ordered.  Risk Prescription drug management.   Patient presented to the ED with complaints of dysuria.  Consider the possibility of urinary retention, UTI,.  Patient's laboratory test do not show any evidence of renal failure.  Blood sugar is slightly elevated but doubt that is causing significant polyuria as he does not have significant glucosuria.  Urinalysis not definitive for infection.  Bladder scan did not show urinary retention.  Patient denies any penile discharge.  Low suspicion for STI but he could have a mild prostatitis.  Will try a course of antibiotics.  Discussed outpatient follow-up to be rechecked.  Also noted to be hypertensive.  He does have a history of same.  Not currently taking any medications.  Will start him on Norvasc recommend outpatient follow-up with PCP.        Final Clinical Impression(s) / ED Diagnoses Final diagnoses:  Polyuria  Uncontrolled hypertension    Rx / DC Orders ED Discharge Orders          Ordered     amLODipine (NORVASC) 5 MG tablet  Daily        08/18/23 1035    doxycycline (VIBRA-TABS) 100 MG tablet  2 times daily        08/18/23 1035  Linwood Dibbles, MD 08/18/23 1040

## 2023-08-18 NOTE — ED Triage Notes (Signed)
Pt states that he has increased urination for the last 3x days. Says he does not feel as if he is retaining at all and is able to empty his bladder. Does endorse some testicle pain, denies any burning while urinating. Pt is diabetic says his sugar was in the 200's this morning.

## 2023-08-21 ENCOUNTER — Telehealth: Payer: Self-pay

## 2023-08-21 NOTE — Progress Notes (Signed)
   Care Guide Note  08/21/2023 Name: Terry Davidson MRN: [id number] DOB: 03/29/1956  Referred by: Patient, No Pcp Per Reason for referral : Care Coordination (Outreach to schedule with Pharm d )   Terry Davidson is a 67 y.o. year old male who is a primary care patient of Patient, No Pcp Per. Terry Davidson was referred to the pharmacist for assistance related to HTN.    An unsuccessful telephone outreach was attempted today to contact the patient who was referred to the pharmacy team for assistance with medication management. Additional attempts will be made to contact the patient.   Penne Lash , RMA     [facility]  [facility], [facility] Guide  Direct Dial: [phone]  Website: [email or web address]
